# Patient Record
Sex: Female | Born: 1985 | Race: White | Hispanic: No | Marital: Single | Smoking: Never smoker
Health system: Southern US, Community
[De-identification: ages and names within clinical notes are randomized; demographics above are authoritative.]

## PROBLEM LIST (undated history)

## (undated) DIAGNOSIS — E119 Type 2 diabetes mellitus without complications: Secondary | ICD-10-CM

## (undated) DIAGNOSIS — K219 Gastro-esophageal reflux disease without esophagitis: Secondary | ICD-10-CM

## (undated) DIAGNOSIS — N201 Calculus of ureter: Secondary | ICD-10-CM

## (undated) DIAGNOSIS — R3915 Urgency of urination: Secondary | ICD-10-CM

## (undated) DIAGNOSIS — R350 Frequency of micturition: Secondary | ICD-10-CM

## (undated) DIAGNOSIS — Z973 Presence of spectacles and contact lenses: Secondary | ICD-10-CM

## (undated) DIAGNOSIS — E282 Polycystic ovarian syndrome: Secondary | ICD-10-CM

## (undated) DIAGNOSIS — Z87898 Personal history of other specified conditions: Secondary | ICD-10-CM

## (undated) HISTORY — DX: Gastro-esophageal reflux disease without esophagitis: K21.9

---

## 2007-10-22 HISTORY — PX: LAPAROSCOPIC CHOLECYSTECTOMY: SUR755

## 2008-05-23 HISTORY — PX: ESOPHAGOGASTRODUODENOSCOPY: SHX1529

## 2010-03-20 HISTORY — PX: COLONOSCOPY: SHX174

## 2010-05-27 HISTORY — PX: APPENDECTOMY: SHX54

## 2011-07-27 ENCOUNTER — Emergency Department (HOSPITAL_BASED_OUTPATIENT_CLINIC_OR_DEPARTMENT_OTHER)
Admission: EM | Admit: 2011-07-27 | Discharge: 2011-07-27 | Disposition: A | Discharge status: Home / Self Care | Payer: PRIVATE HEALTH INSURANCE | Attending: Emergency Medicine | Admitting: Emergency Medicine

## 2011-07-27 ENCOUNTER — Emergency Department (INDEPENDENT_AMBULATORY_CARE_PROVIDER_SITE_OTHER): Payer: PRIVATE HEALTH INSURANCE

## 2011-07-27 ENCOUNTER — Encounter (HOSPITAL_BASED_OUTPATIENT_CLINIC_OR_DEPARTMENT_OTHER): Payer: Self-pay | Admitting: *Deleted

## 2011-07-27 DIAGNOSIS — M25579 Pain in unspecified ankle and joints of unspecified foot: Secondary | ICD-10-CM | POA: Insufficient documentation

## 2011-07-27 DIAGNOSIS — M79671 Pain in right foot: Secondary | ICD-10-CM

## 2011-07-27 DIAGNOSIS — M79609 Pain in unspecified limb: Secondary | ICD-10-CM

## 2011-07-27 DIAGNOSIS — E119 Type 2 diabetes mellitus without complications: Secondary | ICD-10-CM | POA: Insufficient documentation

## 2011-07-27 LAB — GLUCOSE, CAPILLARY: Glucose-Capillary: 181 mg/dL — ABNORMAL HIGH (ref 70–99)

## 2011-07-27 MED ORDER — HYDROCODONE-ACETAMINOPHEN 5-500 MG PO TABS: 1.0000 | ORAL_TABLET | Freq: Four times a day (QID) | ORAL | Status: DC | PRN

## 2011-07-27 MED ORDER — OXYCODONE-ACETAMINOPHEN 5-325 MG PO TABS: 1.0000 | ORAL_TABLET | Freq: Four times a day (QID) | ORAL | Status: AC | PRN

## 2011-07-27 MED ORDER — NAPROXEN 500 MG PO TABS: 500.0000 mg | ORAL_TABLET | Freq: Two times a day (BID) | ORAL | Status: AC

## 2011-07-27 MED ORDER — OXYCODONE-ACETAMINOPHEN 5-325 MG PO TABS
2.0000 | ORAL_TABLET | Freq: Once | ORAL | Status: AC
  Administered 2011-07-27: 2 via ORAL
  Filled 2011-07-27: qty 2

## 2011-07-27 MED ORDER — IBUPROFEN 800 MG PO TABS
800.0000 mg | ORAL_TABLET | Freq: Once | ORAL | Status: AC
  Administered 2011-07-27: 800 mg via ORAL
  Filled 2011-07-27: qty 1

## 2011-07-27 NOTE — ED Notes (Signed)
Pt c/o right foot pain x 2-3 days. Denies injury. Hx diabetes.

## 2011-07-27 NOTE — ED Notes (Signed)
CBG 181. Results to cross over shortly.

## 2011-07-27 NOTE — Discharge Instructions (Signed)
Contusion (Bruise) of Foot Injury to the foot causes bruises (contusions). Contusions are caused by bleeding from small blood vessels that allow blood to leak out into the muscles, cord-like structures that attach muscle to bone (tendons), and/or other soft tissue.  CAUSES  Contusions of the foot are common. Bruises are frequently seen from:  Contact sports injuries.   The use of medications that thin the blood (anti-coagulants).   Aspirin and non-steroidal anti-inflammatory agents that decrease the clotting ability.   People with vitamin deficiencies.  SYMPTOMS  Signs of foot injury include pain and swelling. At first there may be discoloration from blood under the skin. This will appear blue to purple in color. As the bruise ages, the color turns yellow. Swelling may limit the movement of the toes.  Complications from foot injury may include:  Collections of blood leading to disability if calcium deposits form. These can later limit movement in the foot.   Infection of the foot if there are breaks in the skin.   Rupture of the tendons that may need surgical repair.  DIAGNOSIS  Diagnosing foot injuries can be made by observation. If problems continue, X-rays may be needed to make sure there are no broken bones (fractures). Continuing problems may require physical therapy.  HOME CARE INSTRUCTIONS   Apply ice to the injury for 15 to 20 minutes, 3 to 4 times per day. Put the ice in a plastic bag and place a towel between the bag of ice and your skin.   An elastic wrap (like an Ace bandage) may be used to keep swelling down.   Keep foot elevated to reduce swelling and discomfort.   Try to avoid standing or walking while the foot is painful. Do not resume use until instructed by your caregiver. Then begin use gradually. If pain develops, decrease use and continue the above measures. Gradually increase activities that do not cause discomfort until you slowly have normal use.   Only take  over-the-counter or prescription medicines for pain, discomfort, or fever as directed by your caregiver. Use only if your caregiver has not given medications that would interfere.   Begin daily rehabilitation exercises when supportive wrapping is no longer needed.   Use ice massage for 10 minutes before and after workouts. Fill a large styrofoam cup with water and freeze. Tear a small amount of foam from the top so ice protrudes. Massage ice firmly over the injured area in a circle about the size of a softball.   Always eat a well balanced diet.   Follow all instructions for follow up with your caregiver, any orthopedic referrals, physical therapy and rehabilitation. Any delay in obtaining necessary care could result in delayed healing, and temporary or permanent disability.  SEEK IMMEDIATE MEDICAL CARE IF:   Your pain and swelling increase, or pain is uncontrolled with medications.   You have loss of feeling in your foot, or your foot turns cold or blue.   An oral temperature above 102 F (38.9 C) develops, not controlled by medication.   Your foot becomes warm to touch, or you have more pain with movement of your toes.   You have a foot contusion that does not improve in 1 or 2 days.   Skin is broken and signs of infection occur (drainage, increasing pain, fever, headache, muscle aches, dizziness or a general ill feeling).   You develop new, unexplained symptoms, or an increase of the symptoms that brought you to your caregiver.  MAKE SURE YOU:     Understand these instructions.   Will watch your condition.   Will get help right away if you are not doing well or get worse.  Document Released: 03/04/2006 Document Revised: 01/23/2011 Document Reviewed: 04/28/2007 [employer] Patient Information 2012 [employer]. 

## 2011-07-27 NOTE — ED Provider Notes (Addendum)
History   This chart was scribed for Samantha Bailiff, MD by Samantha Mitchell . The patient was seen in room MH08/MH08 and the patient's care was started at 6:17pm.    CSN: [id number]  Arrival date & time 07/27/11  1803   First MD Initiated Contact with Patient 07/27/11 1814      Chief Complaint  Patient presents with  . Foot Pain    (Consider location/radiation/quality/duration/timing/severity/associated sxs/prior treatment) HPI Samantha Mitchell is a 26 y.o. female who presents to the Emergency Department complaining of constant, moderate, lateral right foot pain for the past 2-3 days. Patient states that she was standing at work when the pain started. Patient denies injury. Patient states that she has diabetes mellitus. Patient states that her blood sugar levels have varied over the past few days. No other medical problems noted. No other symptoms reported.     Past Medical History  Diagnosis Date  . Diabetes mellitus     Past Surgical History  Procedure Date  . Appendectomy   . Cholecystectomy     History reviewed. No pertinent family history.  History  Substance Use Topics  . Smoking status: Never Smoker   . Smokeless tobacco: Not on file  . Alcohol Use: No    OB History    Grav Para Term Preterm Abortions TAB SAB Ect Mult Living                  Review of Systems  Musculoskeletal:       Foot pain  All other systems reviewed and are negative.     Allergies  Review of patient's allergies indicates no known allergies.  Home Medications   Current Outpatient Rx  Name Route Sig Dispense Refill  . GLIPIZIDE ER 5 MG PO TB24 Oral Take 5 mg by mouth daily.    Marland Kitchen RANITIDINE HCL 150 MG PO CAPS Oral Take 150 mg by mouth daily.    Marland Kitchen HYDROCODONE-ACETAMINOPHEN 5-500 MG PO TABS Oral Take 1-2 tablets by mouth every 6 (six) hours as needed for pain. 12 tablet 0  . NAPROXEN 500 MG PO TABS Oral Take 1 tablet (500 mg total) by mouth 2 (two) times daily. 30 tablet 0     BP 159/100  Pulse 112  Temp(Src) 98.1 F (36.7 C) (Oral)  Resp 20  Ht 5\' 5"  (1.651 m)  Wt 271 lb (122.925 kg)  BMI 45.10 kg/m2  SpO2 98%  LMP 07/01/2011  Physical Exam  Nursing note and vitals reviewed. Constitutional: She is oriented to person, place, and time. She appears well-developed and well-nourished. No distress.  HENT:  Head: Normocephalic and atraumatic.  Eyes: EOM are normal. Pupils are equal, round, and reactive to light.  Neck: Normal range of motion. Neck supple. No tracheal deviation present.  Cardiovascular: Regular rhythm, normal heart sounds and intact distal pulses.  Exam reveals no gallop and no friction rub.   No murmur heard.      Tachycardic.   Pulmonary/Chest: Effort normal and breath sounds normal. No respiratory distress. She has no wheezes. She has no rales.  Abdominal: Soft. Bowel sounds are normal. She exhibits no distension.  Musculoskeletal: Normal range of motion. She exhibits no edema.       Right lateral foot pain from the midline to distal. No plantar pain. Full ROM of right foot. Intact distal pulses.   Neurological: She is alert and oriented to person, place, and time. No sensory deficit.  Skin: Skin is warm and dry.  Psychiatric:  She has a normal mood and affect. Her behavior is normal.    ED Course  Procedures (including critical care time)  DIAGNOSTIC STUDIES: Oxygen Saturation is 98% on room air, normal by my interpretation.    COORDINATION OF CARE:  1820: Discussed with patient the planned course of treatment.  1830: Medication Orders: Oxycodone-acetaminophen 5-325 mg per tablet 2 tablet-once; Ibuprofen tablet 800 mg-once.  1929: Recheck: Patient informed of imaging results and planned d/c.  Labs Reviewed  GLUCOSE, CAPILLARY - Abnormal; Notable for the following:    Glucose-Capillary 181 (*)    All other components within normal limits   Dg Foot Complete Right  07/27/2011  *RADIOLOGY REPORT*  Clinical Data: Foot pain   RIGHT FOOT COMPLETE - 3+ VIEW  Comparison: None  Findings: There is no evidence of fracture or dislocation.  There is no evidence of arthropathy or other focal bone abnormality. Soft tissues are unremarkable.  IMPRESSION: Negative exam.  Original Report Authenticated By: Rosealee Albee, M.D.     1. Foot pain, right       MDM  HR improved on reassessment. Imaging is negative. She does have a history of diabetes. I encouraged her to followup with her primary care physician on a regular basis for routine foot care. There is no cellulitis or concerning lesions.   I personally performed the services described in this documentation, which was scribed in my presence. The recorded information has been reviewed and considered.       Samantha Bailiff, MD 07/27/11 1937  I was informed by nursing that the patient stated that "Vicodin does not help my pain." She requested Percocet for her pain. She was given the option of the full prescription of Vicodin versus a half prescription of Percocet at which time she shows a half prescription of Percocet.  Samantha Bailiff, MD 07/27/11 1945

## 2012-10-16 ENCOUNTER — Ambulatory Visit (INDEPENDENT_AMBULATORY_CARE_PROVIDER_SITE_OTHER): Payer: BC Managed Care – PPO | Admitting: Family Medicine

## 2012-10-16 VITALS — BP 118/82 | HR 92 | Wt 241.0 lb

## 2012-10-16 DIAGNOSIS — IMO0001 Reserved for inherently not codable concepts without codable children: Secondary | ICD-10-CM

## 2012-10-16 DIAGNOSIS — R6889 Other general symptoms and signs: Secondary | ICD-10-CM

## 2012-10-16 DIAGNOSIS — D72829 Elevated white blood cell count, unspecified: Secondary | ICD-10-CM

## 2012-10-16 DIAGNOSIS — R0989 Other specified symptoms and signs involving the circulatory and respiratory systems: Secondary | ICD-10-CM

## 2012-10-16 LAB — CBC WITH DIFFERENTIAL/PLATELET
Basophils Absolute: 0 10*3/uL (ref 0.0–0.1)
Basophils Relative: 0 % (ref 0–1)
Eosinophils Absolute: 0 10*3/uL (ref 0.0–0.7)
Eosinophils Relative: 0 % (ref 0–5)
HCT: 38.5 % (ref 36.0–46.0)
Hemoglobin: 13.2 g/dL (ref 12.0–15.0)
Lymphocytes Relative: 32 % (ref 12–46)
Lymphs Abs: 3.5 10*3/uL (ref 0.7–4.0)
MCH: 26.4 pg (ref 26.0–34.0)
MCHC: 34.3 g/dL (ref 30.0–36.0)
MCV: 77 fL — ABNORMAL LOW (ref 78.0–100.0)
Monocytes Absolute: 0.8 10*3/uL (ref 0.1–1.0)
Monocytes Relative: 7 % (ref 3–12)
Neutro Abs: 6.6 10*3/uL (ref 1.7–7.7)
Neutrophils Relative %: 61 % (ref 43–77)
Platelets: 408 10*3/uL — ABNORMAL HIGH (ref 150–400)
RBC: 5 MIL/uL (ref 3.87–5.11)
RDW: 14.1 % (ref 11.5–15.5)
WBC: 10.8 10*3/uL — ABNORMAL HIGH (ref 4.0–10.5)

## 2012-10-16 LAB — COMPLETE METABOLIC PANEL WITH GFR
ALT: 18 U/L (ref 0–35)
AST: 16 U/L (ref 0–37)
Albumin: 4.2 g/dL (ref 3.5–5.2)
Alkaline Phosphatase: 44 U/L (ref 39–117)
BUN: 8 mg/dL (ref 6–23)
CO2: 23 mEq/L (ref 19–32)
Calcium: 9.9 mg/dL (ref 8.4–10.5)
Chloride: 106 mEq/L (ref 96–112)
Creat: 0.75 mg/dL (ref 0.50–1.10)
GFR, Est African American: >89 mL/min
GFR, Est Non African American: >89 mL/min
Glucose, Bld: 88 mg/dL (ref 70–99)
Potassium: 4.3 mEq/L (ref 3.5–5.3)
Sodium: 140 mEq/L (ref 135–145)
Total Bilirubin: 0.4 mg/dL (ref 0.3–1.2)
Total Protein: 7.3 g/dL (ref 6.0–8.3)

## 2012-10-16 LAB — HEMOGLOBIN A1C
Hgb A1c MFr Bld: 5.3 % (ref <5.7)
Mean Plasma Glucose: 105 mg/dL (ref <117)

## 2012-10-16 LAB — CHOLESTEROL, TOTAL: Cholesterol: 172 mg/dL (ref 0–200)

## 2012-10-16 MED ORDER — PANTOPRAZOLE SODIUM 40 MG PO TBEC: 40.0000 mg | DELAYED_RELEASE_TABLET | Freq: Every day | ORAL | Status: DC

## 2012-10-16 MED ORDER — METFORMIN HCL ER 500 MG PO TB24: ORAL_TABLET | ORAL | Status: DC

## 2012-10-16 MED ORDER — ESOMEPRAZOLE MAGNESIUM 40 MG PO CPDR: 40.0000 mg | DELAYED_RELEASE_CAPSULE | Freq: Every day | ORAL | Status: DC

## 2012-10-16 NOTE — Patient Instructions (Addendum)
1)  DM - We are rechecking your A1c today.  We'll change you to the extended release version of the metformin.  You can take up to 4 per day.  2)  Stomach/Throat - Try to get back on a PPI to see if this changes the sensation you are having.  If not, then F/U with an ENT.    Gastroesophageal Reflux Disease, Adult Gastroesophageal reflux disease (GERD) happens when acid from your stomach flows up into the esophagus. When acid comes in contact with the esophagus, the acid causes soreness (inflammation) in the esophagus. Over time, GERD may create small holes (ulcers) in the lining of the esophagus. CAUSES   Increased body weight. This puts pressure on the stomach, making acid rise from the stomach into the esophagus.  Smoking. This increases acid production in the stomach.  Drinking alcohol. This causes decreased pressure in the lower esophageal sphincter (valve or ring of muscle between the esophagus and stomach), allowing acid from the stomach into the esophagus.  Late evening meals and a full stomach. This increases pressure and acid production in the stomach.  A malformed lower esophageal sphincter. Sometimes, no cause is found. SYMPTOMS   Burning pain in the lower part of the mid-chest behind the breastbone and in the mid-stomach area. This may occur twice a week or more often.  Trouble swallowing.  Sore throat.  Dry cough.  Asthma-like symptoms including chest tightness, shortness of breath, or wheezing. DIAGNOSIS  Your caregiver may be able to diagnose GERD based on your symptoms. In some cases, X-rays and other tests may be done to check for complications or to check the condition of your stomach and esophagus. TREATMENT  Your caregiver may recommend over-the-counter or prescription medicines to help decrease acid production. Ask your caregiver before starting or adding any new medicines.  HOME CARE INSTRUCTIONS   Change the factors that you can control. Ask your caregiver  for guidance concerning weight loss, quitting smoking, and alcohol consumption.  Avoid foods and drinks that make your symptoms worse, such as:  Caffeine or alcoholic drinks.  Chocolate.  Peppermint or mint flavorings.  Garlic and onions.  Spicy foods.  Citrus fruits, such as oranges, lemons, or limes.  Tomato-based foods such as sauce, chili, salsa, and pizza.  Fried and fatty foods.  Avoid lying down for the 3 hours prior to your bedtime or prior to taking a nap.  Eat small, frequent meals instead of large meals.  Wear loose-fitting clothing. Do not wear anything tight around your waist that causes pressure on your stomach.  Raise the head of your bed 6 to 8 inches with wood blocks to help you sleep. Extra pillows will not help.  Only take over-the-counter or prescription medicines for pain, discomfort, or fever as directed by your caregiver.  Do not take aspirin, ibuprofen, or other nonsteroidal anti-inflammatory drugs (NSAIDs). SEEK IMMEDIATE MEDICAL CARE IF:   You have pain in your arms, neck, jaw, teeth, or back.  Your pain increases or changes in intensity or duration.  You develop nausea, vomiting, or sweating (diaphoresis).  You develop shortness of breath, or you faint.  Your vomit is green, yellow, black, or looks like coffee grounds or blood.  Your stool is red, bloody, or black. These symptoms could be signs of other problems, such as heart disease, gastric bleeding, or esophageal bleeding. MAKE SURE YOU:   Understand these instructions.  Will watch your condition.  Will get help right away if you are not doing  well or get worse. Document Released: 02/20/2005 Document Revised: 08/05/2011 Document Reviewed: 11/30/2010 [facility] Patient Information 2014 [place], [place].

## 2012-10-31 ENCOUNTER — Encounter: Payer: Self-pay | Admitting: Family Medicine

## 2012-10-31 DIAGNOSIS — R6889 Other general symptoms and signs: Secondary | ICD-10-CM | POA: Insufficient documentation

## 2012-10-31 DIAGNOSIS — D72829 Elevated white blood cell count, unspecified: Secondary | ICD-10-CM | POA: Insufficient documentation

## 2012-10-31 DIAGNOSIS — IMO0001 Reserved for inherently not codable concepts without codable children: Secondary | ICD-10-CM | POA: Insufficient documentation

## 2012-10-31 DIAGNOSIS — R0989 Other specified symptoms and signs involving the circulatory and respiratory systems: Secondary | ICD-10-CM | POA: Insufficient documentation

## 2012-10-31 NOTE — Assessment & Plan Note (Signed)
Her WBC count was elevated back in February.  We'll recheck her level.

## 2012-10-31 NOTE — Progress Notes (Signed)
  Subjective:    Patient ID: Samantha Mitchell, female    DOB: 1986/01/05, 27 y.o.   MRN: [id number]  HPI  Samantha Mitchell is in for a recheck of her diabetes.  She also wants to discuss the sensation she has in her throat.  She has done well with her weight loss (40 lbs since November).  She has been taking a combination of Bydureon and Metformin for her diabetes.  She has had some GI upset with the Metformin.   Review of Systems  Constitutional: Positive for appetite change. Negative for fatigue and unexpected weight change (She has been working on her diet (40 lbs loss) ).  HENT:       She feels a sensation/discomfort in her throat.    Endocrine: Negative for polydipsia, polyphagia and polyuria.   Past Medical History  Diagnosis Date  . Diabetes mellitus   . GERD (gastroesophageal reflux disease)   . Acne   . Plantar fasciitis    Family History  Problem Relation Age of Onset  . Hyperlipidemia Mother   . Hypertension Mother   . Cancer Mother     Laryngeal Cancer  . GER disease Mother   . Endometriosis Mother   . Anxiety disorder Mother   . Diabetes Father   . Congestive Heart Failure Father     Viral Cardiomyopathy  . Hyperlipidemia Father   . Heart disease Maternal Grandmother   . Hypertension Maternal Grandmother   . Heart disease Maternal Grandfather   . Hypertension Maternal Grandfather   . Hyperlipidemia Maternal Grandfather   . Heart disease Paternal Grandmother   . Heart disease Paternal Grandfather    History   Social History Narrative   Marital Status:  Engaged Programmer, multimedia)      Children:  None    Pets: Dogs (Daisy/Grumpy)    Living Situation: Lives with parents when she is home.    Occupation: Engineer, materials - [employer]    Education: Bachelor's Degree in [place]      Tobacco Use:  None    Alcohol Use:  Occasional    Drug Use:  None   Diet:  Regular   Exercise:  Limited    Hobbies:  Reading                 Objective:   Physical Exam  Constitutional: She appears  well-nourished. No distress.  HENT:  Head: Normocephalic.  Mouth/Throat: No oropharyngeal exudate.  Eyes: No scleral icterus.  Neck: Normal range of motion. Neck supple. No tracheal deviation present. No thyromegaly present.  Cardiovascular: Normal rate, regular rhythm and normal heart sounds.   Pulmonary/Chest: Effort normal and breath sounds normal.  Abdominal: There is no tenderness.  Musculoskeletal: She exhibits no edema and no tenderness.  Lymphadenopathy:    She has no cervical adenopathy.  Neurological: She is alert.  Skin: Skin is warm and dry.  Psychiatric: She has a normal mood and affect. Her behavior is normal. Judgment and thought content normal.      Assessment & Plan:   TIME 30 MINUTES:  MORE THAN 50 % OF TIME WAS INVOLVED IN COUNSELING.

## 2012-10-31 NOTE — Assessment & Plan Note (Signed)
She feels that she has GI side effects due to the Metformin.  We'll change her to the extended release formulation.  She will remain on Bydureon.  We'll check her A1c.

## 2012-10-31 NOTE — Assessment & Plan Note (Signed)
She is doing to get back on Nexium to see if it will decrease the sensation she has been having in her throat.  If it does not improve, she will F/U with an ENT.

## 2012-12-06 ENCOUNTER — Other Ambulatory Visit: Payer: Self-pay | Admitting: Family Medicine

## 2012-12-07 NOTE — Telephone Encounter (Signed)
Can you contact her and set her up for an appt to recheck her a1C and see Dr. Alberteen Sam. (after 7/23) Thanks PG

## 2012-12-08 NOTE — Telephone Encounter (Signed)
Spoke with the patient and advised that we refilled the medication. Also advised that she needs to come in for an appointment. She stated that she would look at her work schedule and call us back to make appointment.

## 2012-12-25 ENCOUNTER — Encounter: Payer: Self-pay | Admitting: *Deleted

## 2013-01-06 ENCOUNTER — Ambulatory Visit (INDEPENDENT_AMBULATORY_CARE_PROVIDER_SITE_OTHER): Payer: BC Managed Care – PPO | Admitting: Family Medicine

## 2013-01-06 VITALS — BP 116/74 | HR 92 | Resp 16 | Ht 66.5 in | Wt 236.0 lb

## 2013-01-06 DIAGNOSIS — E119 Type 2 diabetes mellitus without complications: Secondary | ICD-10-CM

## 2013-01-06 MED ORDER — METFORMIN HCL ER 500 MG PO TB24: ORAL_TABLET | ORAL | Status: DC

## 2013-01-06 MED ORDER — EXENATIDE ER 2 MG ~~LOC~~ SUSR: 2.0000 mg | SUBCUTANEOUS | Status: DC

## 2013-01-06 MED ORDER — CANAGLIFLOZIN 300 MG PO TABS: 300.0000 mg | ORAL_TABLET | Freq: Every day | ORAL | Status: DC

## 2013-01-06 NOTE — Progress Notes (Signed)
  Subjective:    Patient ID: Samantha Mitchell, female    DOB: 07-24-1985, 27 y.o.   MRN: [id number]  HPI  Samantha Mitchell is here today to follow up on her Type II DM.  She needs her A1c rechecked today.  She has done well on the combination of metformin, Bydureon and Invokana and needs a refill on these medications.      Review of Systems  Constitutional: Negative for activity change, appetite change, fatigue and unexpected weight change.  HENT: Negative.   Eyes: Negative.  Negative for visual disturbance.  Respiratory: Negative for chest tightness and shortness of breath.   Cardiovascular: Negative for chest pain, palpitations and leg swelling.  Gastrointestinal: Negative for diarrhea and constipation.  Endocrine: Negative.  Negative for polydipsia, polyphagia and polyuria.  Genitourinary: Negative for urgency, frequency and difficulty urinating.  Musculoskeletal: Negative.   Skin: Negative.   Neurological: Negative.  Negative for light-headedness and numbness.  Hematological: Negative for adenopathy. Does not bruise/bleed easily.  Psychiatric/Behavioral: Negative for sleep disturbance and dysphoric mood. The patient is not nervous/anxious.     Past Medical History  Diagnosis Date  . Diabetes mellitus   . GERD (gastroesophageal reflux disease)   . Acne   . Plantar fasciitis     Family History  Problem Relation Age of Onset  . Hyperlipidemia Mother   . Hypertension Mother   . Cancer Mother     Laryngeal Cancer  . GER disease Mother   . Endometriosis Mother   . Anxiety disorder Mother   . Diabetes Father   . Congestive Heart Failure Father     Viral Cardiomyopathy  . Hyperlipidemia Father   . Heart disease Maternal Grandmother   . Hypertension Maternal Grandmother   . Heart disease Maternal Grandfather   . Hypertension Maternal Grandfather   . Hyperlipidemia Maternal Grandfather   . Heart disease Paternal Grandmother   . Heart disease Paternal Grandfather     History   Social  History Narrative   Marital Status:  Engaged Programmer, multimedia)      Children:  None    Pets: Dogs (Daisy/Grumpy)    Living Situation: Lives with parents when she is home.    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 Constitutional: She is oriented to person, place, and time.  Eyes: Conjunctivae are normal. No scleral icterus.  Neck: Neck supple. No thyromegaly present.  Cardiovascular: Normal rate, regular rhythm and normal heart sounds.   Pulmonary/Chest: Effort normal and breath sounds normal.  Musculoskeletal: She exhibits no edema and no tenderness.  Lymphadenopathy:    She has no cervical adenopathy.  Neurological: She is alert and oriented to person, place, and time.  Skin: Skin is warm and dry.  Psychiatric: She has a normal mood and affect. Her behavior is normal. Judgment and thought content normal.      Assessment & Plan:

## 2013-01-06 NOTE — Patient Instructions (Addendum)
Nonspecific Tachycardia Tachycardia is a faster than normal heartbeat (more than 100 beats per minute). In adults, the heart normally beats between 60 and 100 times a minute. A fast heartbeat may be a normal response to exercise or stress. It does not necessarily mean that something is wrong. However, sometimes when your heart beats too fast it may not be able to pump enough blood to the rest of your body. This can result in chest pain, shortness of breath, dizziness, and even fainting. Nonspecific tachycardia means that the specific cause or pattern of your tachycardia is unknown. CAUSES  Tachycardia may be harmless or it may be due to a more serious underlying cause. Possible causes of tachycardia include:  Exercise or exertion.  Fever.  Pain or injury.  Infection.  Loss of body fluids (dehydration).  Overactive thyroid.  Lack of red blood cells (anemia).  Anxiety and stress.  Alcohol.  Caffeine.  Tobacco products.  Diet pills.  Illegal drugs.  Heart disease. SYMPTOMS  Rapid or irregular heartbeat (palpitations).  Suddenly feeling your heart beating (cardiac awareness).  Dizziness.  Tiredness (fatigue).  Shortness of breath.  Chest pain.  Nausea.  Fainting. DIAGNOSIS  Your caregiver will perform a physical exam and take your medical history. In some cases, a heart specialist (cardiologist) may be consulted. Your caregiver may also order:  Blood tests.  Electrocardiography. This test records the electrical activity of your heart.  A heart monitoring test. TREATMENT  Treatment will depend on the likely cause of your tachycardia. The goal is to treat the underlying cause of your tachycardia. Treatment methods may include:  Replacement of fluids or blood through an intravenous (IV) tube for moderate to severe dehydration or anemia.  New medicines or changes in your current medicines.  Diet and lifestyle changes.  Treatment for certain  infections.  Stress relief or relaxation methods. HOME CARE INSTRUCTIONS   Rest.  Drink enough fluids to keep your urine clear or pale yellow.  Do not smoke.  Avoid:  Caffeine.  Tobacco.  Alcohol.  Chocolate.  Stimulants such as over-the-counter diet pills or pills that help you stay awake.  Situations that cause anxiety or stress.  Illegal drugs such as marijuana, phencyclidine (PCP), and cocaine.  Only take medicine as directed by your caregiver.  Keep all follow-up appointments as directed by your caregiver. SEEK IMMEDIATE MEDICAL CARE IF:   You have pain in your chest, upper arms, jaw, or neck.  You become weak, dizzy, or feel faint.  You have palpitations that will not go away.  You vomit, have diarrhea, or pass blood in your stool.  Your skin is cool, pale, and wet.  You have a fever that will not go away with rest, fluids, and medicine. MAKE SURE YOU:   Understand these instructions.  Will watch your condition.  Will get help right away if you are not doing well or get worse. Document Released: 06/20/2004 Document Revised: 08/05/2011 Document Reviewed: 04/23/2011 [employer] Patient Information 2014 [employer].  

## 2013-01-21 ENCOUNTER — Other Ambulatory Visit (INDEPENDENT_AMBULATORY_CARE_PROVIDER_SITE_OTHER): Payer: BC Managed Care – PPO | Admitting: *Deleted

## 2013-01-21 DIAGNOSIS — IMO0001 Reserved for inherently not codable concepts without codable children: Secondary | ICD-10-CM

## 2013-01-21 LAB — POCT GLYCOSYLATED HEMOGLOBIN (HGB A1C): Hemoglobin A1C: 5.8

## 2013-02-21 ENCOUNTER — Encounter: Payer: Self-pay | Admitting: Family Medicine

## 2013-02-21 DIAGNOSIS — E119 Type 2 diabetes mellitus without complications: Secondary | ICD-10-CM | POA: Insufficient documentation

## 2013-02-21 NOTE — Assessment & Plan Note (Signed)
Her A1c is great at 5.8%.  She will continue to work on her diet and exercise.  She was given refills for her medications.

## 2013-07-12 ENCOUNTER — Ambulatory Visit (INDEPENDENT_AMBULATORY_CARE_PROVIDER_SITE_OTHER): Payer: BC Managed Care – PPO | Admitting: Family Medicine

## 2013-07-12 ENCOUNTER — Encounter: Payer: Self-pay | Admitting: Family Medicine

## 2013-07-12 VITALS — BP 113/76 | HR 91 | Resp 16 | Ht 65.25 in | Wt 244.0 lb

## 2013-07-12 DIAGNOSIS — E119 Type 2 diabetes mellitus without complications: Secondary | ICD-10-CM

## 2013-07-12 DIAGNOSIS — K219 Gastro-esophageal reflux disease without esophagitis: Secondary | ICD-10-CM

## 2013-07-12 LAB — POCT GLYCOSYLATED HEMOGLOBIN (HGB A1C): Hemoglobin A1C: 5.9

## 2013-07-12 MED ORDER — EXENATIDE ER 2 MG ~~LOC~~ SUSR: 2.0000 mg | SUBCUTANEOUS | Status: DC

## 2013-07-12 MED ORDER — CANAGLIFLOZIN-METFORMIN HCL 150-500 MG PO TABS: 1.0000 | ORAL_TABLET | Freq: Two times a day (BID) | ORAL | Status: DC

## 2013-07-12 MED ORDER — EXENATIDE ER 2 MG ~~LOC~~ PEN: 1.0000 "pen " | PEN_INJECTOR | SUBCUTANEOUS | Status: DC

## 2013-07-12 MED ORDER — PANTOPRAZOLE SODIUM 40 MG PO TBEC: 40.0000 mg | DELAYED_RELEASE_TABLET | Freq: Every day | ORAL | Status: DC

## 2013-07-12 NOTE — Patient Instructions (Addendum)
1)  Blood Sugar - Your A1c has gone up a little so you need to add some exercise to help keep your blood sugars normal.    2)  Weight - If you want to lose 1 lb per week, you will need to decrease your calories by 500 per day - 1 lb = 3500 calories.     Diabetes and Exercise Exercising regularly is important. It is not just about losing weight. It has many health benefits, such as:  Improving your overall fitness, flexibility, and endurance.  Increasing your bone density.  Helping with weight control.  Decreasing your body fat.  Increasing your muscle strength.  Reducing stress and tension.  Improving your overall health. People with diabetes who exercise gain additional benefits because exercise:  Reduces appetite.  Improves the body's use of blood sugar (glucose).  Helps lower or control blood glucose.  Decreases blood pressure.  Helps control blood lipids (such as cholesterol and triglycerides).  Improves the body's use of the hormone insulin by:  Increasing the body's insulin sensitivity.  Reducing the body's insulin needs.  Decreases the risk for heart disease because exercising:  Lowers cholesterol and triglycerides levels.  Increases the levels of good cholesterol (such as high-density lipoproteins [HDL]) in the body.  Lowers blood glucose levels. YOUR ACTIVITY PLAN  Choose an activity that you enjoy and set realistic goals. Your health care provider or diabetes educator can help you make an activity plan that works for you. You can break activities into 2 or 3 sessions throughout the day. Doing so is as good as one long session. Exercise ideas include:  Taking the dog for a walk.  Taking the stairs instead of the elevator.  Dancing to your favorite song.  Doing your favorite exercise with a friend. RECOMMENDATIONS FOR EXERCISING WITH TYPE 1 OR TYPE 2 DIABETES   Check your blood glucose before exercising. If blood glucose levels are greater than 240  mg/dL, check for urine ketones. Do not exercise if ketones are present.  Avoid injecting insulin into areas of the body that are going to be exercised. For example, avoid injecting insulin into:  The arms when playing tennis.  The legs when jogging.  Keep a record of:  Food intake before and after you exercise.  Expected peak times of insulin action.  Blood glucose levels before and after you exercise.  The type and amount of exercise you have done.  Review your records with your health care provider. Your health care provider will help you to develop guidelines for adjusting food intake and insulin amounts before and after exercising.  If you take insulin or oral hypoglycemic agents, watch for signs and symptoms of hypoglycemia. They include:  Dizziness.  Shaking.  Sweating.  Chills.  Confusion.  Drink plenty of water while you exercise to prevent dehydration or heat stroke. Body water is lost during exercise and must be replaced.  Talk to your health care provider before starting an exercise program to make sure it is safe for you. Remember, almost any type of activity is better than none. Document Released: 08/03/2003 Document Revised: 01/13/2013 Document Reviewed: 10/20/2012 [facility] Patient Information 2014 [place]Care, [place].

## 2013-07-12 NOTE — Progress Notes (Signed)
Subjective:    Patient ID: Samantha RaisinSarah Graddy, female    DOB: Sep 09, 1985, 28 y.o.   MRN: [id number]  HPI  [name] is here today with her fiance Reuel Boom(Daniel).  She needs to have several of her Type II DM medications refilled.  She is taking a combination of Metformin (500 mg twice daily), Bydureon once weekly and Invokana (300 mg once daily).  She needs her A1C rechecked.  She admits to not watching her diet as closely and not exercising like she should.     Review of Systems  Constitutional: Negative for activity change, appetite change, fatigue and unexpected weight change.  HENT: Negative.   Eyes: Negative.   Respiratory: Negative for chest tightness and shortness of breath.   Cardiovascular: Negative for chest pain, palpitations and leg swelling.  Gastrointestinal: Negative for diarrhea and constipation.  Endocrine: Negative.  Negative for polydipsia, polyphagia and polyuria.  Genitourinary: Negative for urgency, frequency and difficulty urinating.  Musculoskeletal: Negative.   Skin: Negative.   Neurological: Negative.  Negative for light-headedness and numbness.  Hematological: Negative for adenopathy. Does not bruise/bleed easily.  Psychiatric/Behavioral: Negative for sleep disturbance and dysphoric mood. The patient is not nervous/anxious.      Past Medical History  Diagnosis Date  . Diabetes mellitus   . GERD (gastroesophageal reflux disease)   . Acne   . Plantar fasciitis      Past Surgical History  Procedure Laterality Date  . Appendectomy    . Cholecystectomy       History   Social History Narrative   Marital Status:  Engaged Programmer, multimedia(Daniel)      Children:  None    Pets: Dogs (Daisy/Grumpy)    Living Situation: Lives with parents when she is home.    Occupation: Engineer, materialsTeller - [employer]    Education: Bachelor's Degree in [place]      Tobacco Use:  None    Alcohol Use:  Occasional    Drug Use:  None   Diet:  Regular   Exercise:  Limited    Hobbies:  Reading                Family History  Problem Relation Age of Onset  . Hyperlipidemia Mother   . Hypertension Mother   . Cancer Mother     Laryngeal Cancer  . GER disease Mother   . Endometriosis Mother   . Anxiety disorder Mother   . Diabetes Father   . Congestive Heart Failure Father     Viral Cardiomyopathy  . Hyperlipidemia Father   . Cancer Father   . Heart disease Maternal Grandmother   . Hypertension Maternal Grandmother   . Heart disease Maternal Grandfather   . Hypertension Maternal Grandfather   . Hyperlipidemia Maternal Grandfather   . Heart disease Paternal Grandmother   . Heart disease Paternal Grandfather      Current Outpatient Prescriptions on File Prior to Visit  Medication Sig Dispense Refill  . Canagliflozin (INVOKANA) 300 MG TABS Take 1 tablet (300 mg total) by mouth daily.  30 tablet  5  . metFORMIN (GLUCOPHAGE XR) 500 MG 24 hr tablet Take up to 4 tabs per day  120 tablet  5  . NUVARING 0.12-0.015 MG/24HR vaginal ring        No current facility-administered medications on file prior to visit.     No Known Allergies      Objective:   Physical Exam  Vitals reviewed. Constitutional: She is oriented to person, place, and time.  Eyes:  Conjunctivae are normal. No scleral icterus.  Neck: Neck supple. No thyromegaly present.  Cardiovascular: Normal rate, regular rhythm and normal heart sounds.   Pulmonary/Chest: Effort normal and breath sounds normal.  Musculoskeletal: She exhibits no edema and no tenderness.  Lymphadenopathy:    She has no cervical adenopathy.  Neurological: She is alert and oriented to person, place, and time.  Skin: Skin is warm and dry.  Psychiatric: She has a normal mood and affect. Her behavior is normal. Judgment and thought content normal.      Assessment & Plan:    Katianne was seen today for medication management.  Diagnoses and associated orders for this visit:  Type II or unspecified type diabetes mellitus without mention of  complication, not stated as uncontrolled Comments: Her A1c has increased from 5.8% to 5.9%. She is going to get back to watching her diet more closely and she is to increase her exercise.    - POCT glycosylated hemoglobin (Hb A1C) - Canagliflozin-Metformin HCl 150-500 MG TABS; Take 1 tablet by mouth 2 (two) times daily. - Exenatide ER (BYDUREON) 2 MG SUSR; Inject 2 mg into the skin once a week. - Exenatide ER (BYDUREON) 2 MG PEN; Inject 1 pen into the skin once a week.  GERD (gastroesophageal reflux disease) - pantoprazole (PROTONIX) 40 MG tablet; Take 1 tablet (40 mg total) by mouth daily.

## 2013-09-19 DIAGNOSIS — K219 Gastro-esophageal reflux disease without esophagitis: Secondary | ICD-10-CM | POA: Insufficient documentation

## 2013-10-11 ENCOUNTER — Telehealth: Payer: Self-pay

## 2013-10-11 NOTE — Telephone Encounter (Signed)
[facility] called and wants to know if she should be taking both metformin and invokana, please call them back at [phone] and let them know.

## 2013-10-30 ENCOUNTER — Emergency Department (HOSPITAL_BASED_OUTPATIENT_CLINIC_OR_DEPARTMENT_OTHER)
Admission: EM | Admit: 2013-10-30 | Discharge: 2013-10-30 | Disposition: A | Discharge status: Home / Self Care | Payer: BC Managed Care – PPO | Attending: Emergency Medicine | Admitting: Emergency Medicine

## 2013-10-30 ENCOUNTER — Encounter (HOSPITAL_BASED_OUTPATIENT_CLINIC_OR_DEPARTMENT_OTHER): Payer: Self-pay | Admitting: Emergency Medicine

## 2013-10-30 ENCOUNTER — Emergency Department (HOSPITAL_BASED_OUTPATIENT_CLINIC_OR_DEPARTMENT_OTHER): Payer: BC Managed Care – PPO

## 2013-10-30 DIAGNOSIS — K219 Gastro-esophageal reflux disease without esophagitis: Secondary | ICD-10-CM | POA: Insufficient documentation

## 2013-10-30 DIAGNOSIS — E119 Type 2 diabetes mellitus without complications: Secondary | ICD-10-CM | POA: Insufficient documentation

## 2013-10-30 DIAGNOSIS — Z3202 Encounter for pregnancy test, result negative: Secondary | ICD-10-CM | POA: Insufficient documentation

## 2013-10-30 DIAGNOSIS — Z9089 Acquired absence of other organs: Secondary | ICD-10-CM | POA: Insufficient documentation

## 2013-10-30 DIAGNOSIS — Z872 Personal history of diseases of the skin and subcutaneous tissue: Secondary | ICD-10-CM | POA: Insufficient documentation

## 2013-10-30 DIAGNOSIS — Z8739 Personal history of other diseases of the musculoskeletal system and connective tissue: Secondary | ICD-10-CM | POA: Insufficient documentation

## 2013-10-30 DIAGNOSIS — N201 Calculus of ureter: Secondary | ICD-10-CM

## 2013-10-30 DIAGNOSIS — Z79899 Other long term (current) drug therapy: Secondary | ICD-10-CM | POA: Insufficient documentation

## 2013-10-30 LAB — URINE MICROSCOPIC-ADD ON

## 2013-10-30 LAB — URINALYSIS, ROUTINE W REFLEX MICROSCOPIC
BILIRUBIN URINE: NEGATIVE
GLUCOSE, UA: NEGATIVE mg/dL
KETONES UR: 15 mg/dL — AB
NITRITE: NEGATIVE
PH: 5 (ref 5.0–8.0)
PROTEIN: 100 mg/dL — AB
Specific Gravity, Urine: 1.027 (ref 1.005–1.030)
Urobilinogen, UA: 0.2 mg/dL (ref 0.0–1.0)

## 2013-10-30 LAB — PREGNANCY, URINE: PREG TEST UR: NEGATIVE

## 2013-10-30 MED ORDER — PROMETHAZINE HCL 25 MG/ML IJ SOLN
12.5000 mg | Freq: Once | INTRAMUSCULAR | Status: AC
  Administered 2013-10-30: 12.5 mg via INTRAVENOUS
  Filled 2013-10-30: qty 1

## 2013-10-30 MED ORDER — TAMSULOSIN HCL 0.4 MG PO CAPS: ORAL_CAPSULE | ORAL | Status: DC

## 2013-10-30 MED ORDER — OXYCODONE-ACETAMINOPHEN 10-325 MG PO TABS: 1.0000 | ORAL_TABLET | ORAL | Status: DC | PRN

## 2013-10-30 MED ORDER — ONDANSETRON HCL 4 MG/2ML IJ SOLN
4.0000 mg | Freq: Once | INTRAMUSCULAR | Status: AC
  Administered 2013-10-30: 4 mg via INTRAVENOUS
  Filled 2013-10-30: qty 2

## 2013-10-30 MED ORDER — TAMSULOSIN HCL 0.4 MG PO CAPS
0.4000 mg | ORAL_CAPSULE | Freq: Once | ORAL | Status: AC
  Administered 2013-10-30: 0.4 mg via ORAL
  Filled 2013-10-30: qty 1

## 2013-10-30 MED ORDER — FENTANYL CITRATE 0.05 MG/ML IJ SOLN
100.0000 ug | Freq: Once | INTRAMUSCULAR | Status: AC
  Administered 2013-10-30: 100 ug via INTRAVENOUS
  Filled 2013-10-30: qty 2

## 2013-10-30 MED ORDER — KETOROLAC TROMETHAMINE 15 MG/ML IJ SOLN
15.0000 mg | Freq: Once | INTRAMUSCULAR | Status: AC
  Administered 2013-10-30: 15 mg via INTRAVENOUS
  Filled 2013-10-30: qty 1

## 2013-10-30 MED ORDER — SODIUM CHLORIDE 0.9 % IV SOLN
INTRAVENOUS | Status: DC
  Administered 2013-10-30: 03:00:00 via INTRAVENOUS

## 2013-10-30 MED ORDER — PROMETHAZINE HCL 25 MG PO TABS: 25.0000 mg | ORAL_TABLET | Freq: Four times a day (QID) | ORAL | Status: DC | PRN

## 2013-10-30 MED ORDER — NAPROXEN SODIUM 220 MG PO TABS: ORAL_TABLET | ORAL | Status: DC

## 2013-10-30 NOTE — Discharge Instructions (Signed)
Ureteral Colic (Kidney Stones) °Ureteral colic is the result of a condition when kidney stones form inside the kidney. Once kidney stones are formed they may move into the tube that connects the kidney with the bladder (ureter). If this occurs, this condition may cause pain (colic) in the ureter.  °CAUSES  °Pain is caused by stone movement in the ureter and the obstruction caused by the stone. °SYMPTOMS  °The pain comes and goes as the ureter contracts around the stone. The pain is usually intense, sharp, and stabbing in character. The location of the pain may move as the stone moves through the ureter. When the stone is near the kidney the pain is usually located in the back and radiates to the belly (abdomen). When the stone is ready to pass into the bladder the pain is often located in the lower abdomen on the side the stone is located. At this location, the symptoms may mimic those of a urinary tract infection with urinary frequency. Once the stone is located here it often passes into the bladder and the pain disappears completely. °TREATMENT  °· Your caregiver will provide you with medicine for pain relief. °· You may require specialized follow-up X-rays. °· The absence of pain does not always mean that the stone has passed. It may have just stopped moving. If the urine remains completely obstructed, it can cause loss of kidney function or even complete destruction of the involved kidney. It is your responsibility and in your interest that X-rays and follow-ups as suggested by your caregiver are completed. Relief of pain without passage of the stone can be associated with severe damage to the kidney, including loss of kidney function on that side. °· If your stone does not pass on its own, additional measures may be taken by your caregiver to ensure its removal. °HOME CARE INSTRUCTIONS  °· Increase your fluid intake. Water is the preferred fluid since juices containing vitamin C may acidify the urine making it  less likely for certain stones (uric acid stones) to pass. °· Strain all urine. A strainer will be provided. Keep all particulate matter or stones for your caregiver to inspect. °· Take your pain medicine as directed. °· Make a follow-up appointment with your caregiver as directed. °· Remember that the goal is passage of your stone. The absence of pain does not mean the stone is gone. Follow your caregiver's instructions. °· Only take over-the-counter or prescription medicines for pain, discomfort, or fever as directed by your caregiver. °SEEK MEDICAL CARE IF:  °· Pain cannot be controlled with the prescribed medicine. °· You have a fever. °· Pain continues for longer than your caregiver advises it should. °· There is a change in the pain, and you develop chest discomfort or constant abdominal pain. °· You feel faint or pass out. °MAKE SURE YOU:  °· Understand these instructions. °· Will watch your condition. °· Will get help right away if you are not doing well or get worse. °Document Released: 02/20/2005 Document Revised: 09/07/2012 Document Reviewed: 11/07/2010 °ExitCare® Patient Information ©2014 [employer], LLC. ° °

## 2013-10-30 NOTE — ED Notes (Signed)
Pt c/o rt flank pain radiating to RLQ; pain on urination, off and on x70month

## 2013-10-30 NOTE — ED Provider Notes (Signed)
CSN: [id number]     Arrival date & time 10/30/13  0241 History   First MD Initiated Contact with Patient 10/30/13 0256     Chief Complaint  Patient presents with  . Flank Pain     (Consider location/radiation/quality/duration/timing/severity/associated sxs/prior Treatment) HPI This is a 28 year old female with a history of diabetes. She is here with right flank pain that began about an hour ago. It is in her lower right flank and radiating to her right lower quadrant and suprapubic region. She rates moderate to severe. It is worse with movement or palpation. It is associated with nausea but no vomiting. She is having pain with urination which she does not describe as burning. She has had similar episodes in the past and has been told that she had a urinary tract infection or possibly a kidney stone but has never had a CT scan to evaluate for kidney stone.  Past Medical History  Diagnosis Date  . Diabetes mellitus   . GERD (gastroesophageal reflux disease)   . Acne   . Plantar fasciitis    Past Surgical History  Procedure Laterality Date  . Appendectomy    . Cholecystectomy     Family History  Problem Relation Age of Onset  . Hyperlipidemia Mother   . Hypertension Mother   . Cancer Mother     Laryngeal Cancer  . GER disease Mother   . Endometriosis Mother   . Anxiety disorder Mother   . Diabetes Father   . Congestive Heart Failure Father     Viral Cardiomyopathy  . Hyperlipidemia Father   . Cancer Father   . Heart disease Maternal Grandmother   . Hypertension Maternal Grandmother   . Heart disease Maternal Grandfather   . Hypertension Maternal Grandfather   . Hyperlipidemia Maternal Grandfather   . Heart disease Paternal Grandmother   . Heart disease Paternal Grandfather    History  Substance Use Topics  . Smoking status: Never Smoker   . Smokeless tobacco: Never Used  . Alcohol Use: No   OB History   Grav Para Term Preterm Abortions TAB SAB Ect Mult Living                  Review of Systems  All other systems reviewed and are negative.   Allergies  Review of patient's allergies indicates no known allergies.  Home Medications   Prior to Admission medications   Medication Sig Start Date End Date Taking? Authorizing Provider  Exenatide ER (BYDUREON) 2 MG PEN Inject 1 pen into the skin once a week. 07/12/13 07/12/14 Yes Gillian Scarceobyn K Zanard, MD  Exenatide ER (BYDUREON) 2 MG SUSR Inject 2 mg into the skin once a week. 07/12/13 07/12/14 Yes Gillian Scarceobyn K Zanard, MD  metFORMIN (GLUCOPHAGE XR) 500 MG 24 hr tablet Take up to 4 tabs per day 01/06/13 01/06/14 Yes Gillian Scarceobyn K Zanard, MD  NUVARING 0.12-0.015 MG/24HR vaginal ring  10/10/12  Yes Historical Provider, MD  pantoprazole (PROTONIX) 40 MG tablet Take 1 tablet (40 mg total) by mouth daily. 07/12/13 07/12/14 Yes Gillian Scarceobyn K Zanard, MD  Canagliflozin (INVOKANA) 300 MG TABS Take 1 tablet (300 mg total) by mouth daily. 01/06/13 01/06/14  Gillian Scarceobyn K Zanard, MD  Canagliflozin-Metformin HCl 150-500 MG TABS Take 1 tablet by mouth 2 (two) times daily. 07/12/13 07/12/14  Gillian Scarceobyn K Zanard, MD   BP 133/85  Pulse 92  Temp(Src) 98.3 F (36.8 C) (Oral)  Resp 18  SpO2 100%  LMP 09/29/2013  Physical Exam General: Well-developed,  well-nourished female in no acute distress; appearance consistent with age of record HENT: normocephalic; atraumatic Eyes: pupils equal, round and reactive to light; extraocular muscles intact Neck: supple Heart: regular rate and rhythm Lungs: clear to auscultation bilaterally Abdomen: soft; nondistended; suprapubic tenderness which also causes pain to be felt in the right lower back; no masses or hepatosplenomegaly; bowel sounds present GU: Right CVA tenderness Extremities: No deformity; full range of motion; pulses normal; trace edema of lower legs Neurologic: Awake, alert and oriented; motor function intact in all extremities and symmetric; no facial droop Skin: Warm and dry Psychiatric: Normal mood and  affect    ED Course  Procedures (including critical care time)  MDM   Nursing notes and vitals signs, including pulse oximetry, reviewed.  Summary of this visit's results, reviewed by myself:  Labs:  Results for orders placed during the hospital encounter of 10/30/13 (from the past 24 hour(s))  PREGNANCY, URINE     Status: None   Collection Time    10/30/13  3:55 AM      Result Value Ref Range   Preg Test, Ur NEGATIVE  NEGATIVE  URINALYSIS, ROUTINE W REFLEX MICROSCOPIC     Status: Abnormal   Collection Time    10/30/13  3:55 AM      Result Value Ref Range   Color, Urine AMBER (*) YELLOW   APPearance CLOUDY (*) CLEAR   Specific Gravity, Urine 1.027  1.005 - 1.030   pH 5.0  5.0 - 8.0   Glucose, UA NEGATIVE  NEGATIVE mg/dL   Hgb urine dipstick LARGE (*) NEGATIVE   Bilirubin Urine NEGATIVE  NEGATIVE   Ketones, ur 15 (*) NEGATIVE mg/dL   Protein, ur 960 (*) NEGATIVE mg/dL   Urobilinogen, UA 0.2  0.0 - 1.0 mg/dL   Nitrite NEGATIVE  NEGATIVE   Leukocytes, UA SMALL (*) NEGATIVE  URINE MICROSCOPIC-ADD ON     Status: Abnormal   Collection Time    10/30/13  3:55 AM      Result Value Ref Range   Squamous Epithelial / LPF FEW (*) RARE   WBC, UA 3-6  <3 WBC/hpf   RBC / HPF TOO NUMEROUS TO COUNT  <3 RBC/hpf   Bacteria, UA FEW (*) RARE    Imaging Studies: Ct Abdomen Pelvis Wo Contrast  10/30/2013   CLINICAL DATA:  right flank pain; hematuria  EXAM: CT ABDOMEN AND PELVIS WITHOUT CONTRAST  TECHNIQUE: Multidetector CT imaging of the abdomen and pelvis was performed following the standard protocol without IV contrast.  COMPARISON:  None available  FINDINGS: The visualized lung bases are clear.  Limited noncontrast evaluation of the liver is unremarkable. Gallbladder is surgically absent. No biliary ductal dilatation. The spleen, adrenal glands, and pancreas demonstrate a normal unenhanced appearance.  The left kidney is unremarkable without evidence of nephrolithiasis or hydronephrosis. No  stones seen along the course of the left renal collecting system.  On the right, there is an obstructive 4 mm calculus within the mid right ureter (series 2, image 53). There is secondary mild right hydroureteronephrosis with mild periureteral fat stranding. No other stones seen within the right kidney or distally within the right renal collecting system.  Stomach is within normal limits. No evidence of bowel obstruction. No acute inflammatory changes seen about the bowels. Appendix is normal.  Bladder is decompressed and not well evaluated. Uterus and ovaries within normal limits.  No free air or fluid. No enlarged intra-abdominal pelvic lymph nodes.  No acute osseous abnormality. No  worrisome lytic or blastic osseous lesions.  IMPRESSION: 1. 4 mm obstructive stone within the mid right ureter with secondary mild right hydroureteronephrosis. 2. No other acute intra-abdominal pelvic abnormality. Normal appendix.   Electronically Signed   By: Rise Mu M.D.   On: 10/30/2013 05:00   5:47 AM Patient's pain and nausea now well controlled. Urinalysis is not diagnostic of a UTI but we will send it for culture as a precaution. Patient was advised a 4 mm stone was about 80% likely to pass on its own but we will refer her to urology should her symptoms worsen or her stone failed to pass.     Hanley Seamen, MD 10/30/13 [phone]

## 2013-10-31 LAB — URINE CULTURE

## 2013-12-02 ENCOUNTER — Other Ambulatory Visit: Payer: Self-pay | Admitting: Urology

## 2013-12-02 ENCOUNTER — Encounter (HOSPITAL_BASED_OUTPATIENT_CLINIC_OR_DEPARTMENT_OTHER): Payer: Self-pay | Admitting: *Deleted

## 2013-12-02 NOTE — Progress Notes (Addendum)
NPO AFTER MN WITH EXCEPTION CLEAR LIQUIDS UNTIL 0800 (NO CREAM/ MILK PRODUCTS). ARRIVE AT 1230. NEEDS ISTAT , URINE PREG. AND EKG. WILL TAKE PROTONIX AND BCP AM DOS W/ SIPS OF WATER AND IF NEEDED TAKE PAIN/ NAUSEA RX.

## 2013-12-03 ENCOUNTER — Encounter (HOSPITAL_BASED_OUTPATIENT_CLINIC_OR_DEPARTMENT_OTHER): Payer: BC Managed Care – PPO | Admitting: Anesthesiology

## 2013-12-03 ENCOUNTER — Ambulatory Visit (HOSPITAL_BASED_OUTPATIENT_CLINIC_OR_DEPARTMENT_OTHER): Payer: BC Managed Care – PPO | Admitting: Anesthesiology

## 2013-12-03 ENCOUNTER — Encounter (HOSPITAL_BASED_OUTPATIENT_CLINIC_OR_DEPARTMENT_OTHER)
Admission: RE | Disposition: A | Discharge status: Home / Self Care | Payer: Self-pay | Source: Ambulatory Visit | Attending: Urology

## 2013-12-03 ENCOUNTER — Other Ambulatory Visit: Payer: Self-pay

## 2013-12-03 ENCOUNTER — Encounter (HOSPITAL_BASED_OUTPATIENT_CLINIC_OR_DEPARTMENT_OTHER): Payer: Self-pay

## 2013-12-03 ENCOUNTER — Ambulatory Visit (HOSPITAL_BASED_OUTPATIENT_CLINIC_OR_DEPARTMENT_OTHER)
Admission: RE | Admit: 2013-12-03 | Discharge: 2013-12-03 | Disposition: A | Discharge status: Home / Self Care | Payer: BC Managed Care – PPO | Source: Ambulatory Visit | Attending: Urology | Admitting: Urology

## 2013-12-03 DIAGNOSIS — E119 Type 2 diabetes mellitus without complications: Secondary | ICD-10-CM | POA: Insufficient documentation

## 2013-12-03 DIAGNOSIS — Z79899 Other long term (current) drug therapy: Secondary | ICD-10-CM | POA: Insufficient documentation

## 2013-12-03 DIAGNOSIS — K219 Gastro-esophageal reflux disease without esophagitis: Secondary | ICD-10-CM | POA: Insufficient documentation

## 2013-12-03 DIAGNOSIS — N201 Calculus of ureter: Secondary | ICD-10-CM | POA: Insufficient documentation

## 2013-12-03 HISTORY — PX: HOLMIUM LASER APPLICATION: SHX5852

## 2013-12-03 HISTORY — DX: Type 2 diabetes mellitus without complications: E11.9

## 2013-12-03 HISTORY — DX: Frequency of micturition: R35.0

## 2013-12-03 HISTORY — DX: Urgency of urination: R39.15

## 2013-12-03 HISTORY — DX: Personal history of other specified conditions: Z87.898

## 2013-12-03 HISTORY — PX: CYSTOSCOPY WITH RETROGRADE PYELOGRAM, URETEROSCOPY AND STENT PLACEMENT: SHX5789

## 2013-12-03 HISTORY — DX: Calculus of ureter: N20.1

## 2013-12-03 HISTORY — DX: Polycystic ovarian syndrome: E28.2

## 2013-12-03 HISTORY — DX: Presence of spectacles and contact lenses: Z97.3

## 2013-12-03 LAB — POCT I-STAT 4, (NA,K, GLUC, HGB,HCT)
Glucose, Bld: 80 mg/dL (ref 70–99)
HCT: 37 % (ref 36.0–46.0)
HEMOGLOBIN: 12.6 g/dL (ref 12.0–15.0)
Potassium: 3.9 mEq/L (ref 3.7–5.3)
SODIUM: 142 meq/L (ref 137–147)

## 2013-12-03 LAB — GLUCOSE, CAPILLARY: GLUCOSE-CAPILLARY: 85 mg/dL (ref 70–99)

## 2013-12-03 LAB — POCT PREGNANCY, URINE: Preg Test, Ur: NEGATIVE

## 2013-12-03 SURGERY — CYSTOURETEROSCOPY, WITH RETROGRADE PYELOGRAM AND STENT INSERTION
Anesthesia: General | Site: Ureter | Laterality: Right

## 2013-12-03 MED ORDER — PROMETHAZINE HCL 25 MG/ML IJ SOLN
6.2500 mg | INTRAMUSCULAR | Status: DC | PRN
  Filled 2013-12-03: qty 1

## 2013-12-03 MED ORDER — FENTANYL CITRATE 0.05 MG/ML IJ SOLN
INTRAMUSCULAR | Status: DC | PRN
  Administered 2013-12-03: 50 ug via INTRAVENOUS
  Administered 2013-12-03 (×6): 25 ug via INTRAVENOUS

## 2013-12-03 MED ORDER — OXYCODONE HCL 5 MG PO TABS
5.0000 mg | ORAL_TABLET | Freq: Once | ORAL | Status: AC | PRN
  Administered 2013-12-03: 5 mg via ORAL
  Filled 2013-12-03: qty 1

## 2013-12-03 MED ORDER — LACTATED RINGERS IV SOLN
INTRAVENOUS | Status: DC | PRN
  Administered 2013-12-03 (×2): via INTRAVENOUS

## 2013-12-03 MED ORDER — LACTATED RINGERS IV SOLN
INTRAVENOUS | Status: DC
  Administered 2013-12-03: 14:00:00 via INTRAVENOUS
  Filled 2013-12-03: qty 1000

## 2013-12-03 MED ORDER — OXYCODONE HCL 5 MG PO TABS
ORAL_TABLET | ORAL | Status: AC
  Filled 2013-12-03: qty 1

## 2013-12-03 MED ORDER — FENTANYL CITRATE 0.05 MG/ML IJ SOLN
INTRAMUSCULAR | Status: AC
  Filled 2013-12-03: qty 4

## 2013-12-03 MED ORDER — LIDOCAINE HCL (CARDIAC) 20 MG/ML IV SOLN
INTRAVENOUS | Status: DC | PRN
Start: 2013-12-03 — End: 2013-12-03
  Administered 2013-12-03: 100 mg via INTRAVENOUS

## 2013-12-03 MED ORDER — ONDANSETRON HCL 4 MG/2ML IJ SOLN
INTRAMUSCULAR | Status: DC | PRN
  Administered 2013-12-03: 4 mg via INTRAVENOUS

## 2013-12-03 MED ORDER — LIDOCAINE HCL 2 % EX GEL
CUTANEOUS | Status: DC | PRN
  Administered 2013-12-03: 1 via URETHRAL

## 2013-12-03 MED ORDER — OXYCODONE HCL 5 MG/5ML PO SOLN
5.0000 mg | Freq: Once | ORAL | Status: AC | PRN
  Filled 2013-12-03: qty 5

## 2013-12-03 MED ORDER — CEFAZOLIN SODIUM-DEXTROSE 2-3 GM-% IV SOLR
2.0000 g | INTRAVENOUS | Status: AC
  Administered 2013-12-03: 2 g via INTRAVENOUS
  Filled 2013-12-03: qty 50

## 2013-12-03 MED ORDER — ACETAMINOPHEN 10 MG/ML IV SOLN
INTRAVENOUS | Status: DC | PRN
  Administered 2013-12-03: 1000 mg via INTRAVENOUS

## 2013-12-03 MED ORDER — MIDAZOLAM HCL 2 MG/2ML IJ SOLN
INTRAMUSCULAR | Status: AC
  Filled 2013-12-03: qty 2

## 2013-12-03 MED ORDER — SODIUM CHLORIDE 0.9 % IR SOLN
Status: DC | PRN
  Administered 2013-12-03: 6000 mL

## 2013-12-03 MED ORDER — PROPOFOL 10 MG/ML IV BOLUS
INTRAVENOUS | Status: DC | PRN
  Administered 2013-12-03: 100 mg via INTRAVENOUS
  Administered 2013-12-03: 200 mg via INTRAVENOUS

## 2013-12-03 MED ORDER — KETOROLAC TROMETHAMINE 30 MG/ML IJ SOLN
INTRAMUSCULAR | Status: DC | PRN
  Administered 2013-12-03: 30 mg via INTRAVENOUS

## 2013-12-03 MED ORDER — GLYCOPYRROLATE 0.2 MG/ML IJ SOLN
INTRAMUSCULAR | Status: DC | PRN
  Administered 2013-12-03: 0.2 mg via INTRAVENOUS

## 2013-12-03 MED ORDER — METOCLOPRAMIDE HCL 5 MG/ML IJ SOLN
INTRAMUSCULAR | Status: DC | PRN
  Administered 2013-12-03: 10 mg via INTRAVENOUS

## 2013-12-03 MED ORDER — MEPERIDINE HCL 25 MG/ML IJ SOLN
6.2500 mg | INTRAMUSCULAR | Status: DC | PRN
  Filled 2013-12-03: qty 1

## 2013-12-03 MED ORDER — MIDAZOLAM HCL 5 MG/5ML IJ SOLN
INTRAMUSCULAR | Status: DC | PRN
  Administered 2013-12-03 (×2): 1 mg via INTRAVENOUS

## 2013-12-03 MED ORDER — HYDROMORPHONE HCL PF 1 MG/ML IJ SOLN
0.2500 mg | INTRAMUSCULAR | Status: DC | PRN
  Filled 2013-12-03: qty 1

## 2013-12-03 SURGICAL SUPPLY — 33 items
ADAPTER CATH URET PLST 4-6FR (CATHETERS) IMPLANT
BAG DRAIN URO-CYSTO SKYTR STRL (DRAIN) ×3 IMPLANT
BASKET LASER NITINOL 1.9FR (BASKET) IMPLANT
BASKET STNLS GEMINI 4WIRE 3FR (BASKET) IMPLANT
BASKET ZERO TIP NITINOL 2.4FR (BASKET) ×3 IMPLANT
BENZOIN TINCTURE PRP APPL 2/3 (GAUZE/BANDAGES/DRESSINGS) IMPLANT
CANISTER SUCT LVC 12 LTR MEDI- (MISCELLANEOUS) IMPLANT
CATH INTERMIT  6FR 70CM (CATHETERS) IMPLANT
CATH URET 5FR 28IN CONE TIP (BALLOONS)
CATH URET 5FR 28IN OPEN ENDED (CATHETERS) ×3 IMPLANT
CATH URET 5FR 70CM CONE TIP (BALLOONS) IMPLANT
CLOTH BEACON ORANGE TIMEOUT ST (SAFETY) ×3 IMPLANT
DRAPE CAMERA CLOSED 9X96 (DRAPES) ×3 IMPLANT
DRSG TEGADERM 2-3/8X2-3/4 SM (GAUZE/BANDAGES/DRESSINGS) IMPLANT
FIBER LASER FLEXIVA 1000 (UROLOGICAL SUPPLIES) IMPLANT
FIBER LASER FLEXIVA 200 (UROLOGICAL SUPPLIES) IMPLANT
FIBER LASER FLEXIVA 365 (UROLOGICAL SUPPLIES) ×3 IMPLANT
FIBER LASER FLEXIVA 550 (UROLOGICAL SUPPLIES) IMPLANT
GLOVE BIO SURGEON STRL SZ7.5 (GLOVE) ×3 IMPLANT
GOWN STRL REIN XL XLG (GOWN DISPOSABLE) ×3 IMPLANT
GUIDEWIRE 0.038 PTFE COATED (WIRE) IMPLANT
GUIDEWIRE ANG ZIPWIRE 038X150 (WIRE) IMPLANT
GUIDEWIRE STR DUAL SENSOR (WIRE) ×3 IMPLANT
IV NS IRRIG 3000ML ARTHROMATIC (IV SOLUTION) ×6 IMPLANT
KIT BALLIN UROMAX 15FX10 (LABEL) IMPLANT
KIT BALLN UROMAX 15FX4 (MISCELLANEOUS) IMPLANT
KIT BALLN UROMAX 26 75X4 (MISCELLANEOUS)
LASER FIBER DISP (UROLOGICAL SUPPLIES) IMPLANT
NS IRRIG 500ML POUR BTL (IV SOLUTION) IMPLANT
PACK CYSTOSCOPY (CUSTOM PROCEDURE TRAY) ×3 IMPLANT
SET HIGH PRES BAL DIL (LABEL)
SHEATH ACCESS URETERAL 38CM (SHEATH) IMPLANT
SHEATH ACCESS URETERAL 54CM (SHEATH) IMPLANT

## 2013-12-03 NOTE — Anesthesia Procedure Notes (Signed)
Procedure Name: LMA Insertion Date/Time: 12/03/2013 2:04 PM Performed by: Jessica PriestBEESON, Samantha Candella C Pre-anesthesia Checklist: Patient identified, Emergency Drugs available, Suction available and Patient being monitored Patient Re-evaluated:Patient Re-evaluated prior to inductionOxygen Delivery Method: Circle System Utilized Preoxygenation: Pre-oxygenation with 100% oxygen Intubation Type: IV induction Ventilation: Mask ventilation without difficulty LMA: LMA inserted LMA Size: 4.0 Number of attempts: 1 Airway Equipment and Method: bite block Placement Confirmation: positive ETCO2 Tube secured with: Tape Dental Injury: Teeth and Oropharynx as per pre-operative assessment

## 2013-12-03 NOTE — Discharge Instructions (Signed)

## 2013-12-03 NOTE — Interval H&P Note (Signed)
History and Physical Interval Note:  12/03/2013 1:58 PM  Samantha Mitchell Senters  has presented today for surgery, with the diagnosis of Right Ureteral Calculus  The various methods of treatment have been discussed with the patient and family. After consideration of risks, benefits and other options for treatment, the patient has consented to  Procedure(s) with comments: CYSTOSCOPY WITH RETROGRADE PYELOGRAM, URETEROSCOPY AND STENT PLACEMENT (Right) HOLMIUM LASER APPLICATION (Right) - POSSIBLE HOLMIUM LASER as a surgical intervention .  The patient's history has been reviewed, patient examined, no change in status, stable for surgery.  I have reviewed the patient's chart and labs.  Questions were answered to the patient's satisfaction.     Kimbely Whiteaker S

## 2013-12-03 NOTE — H&P (Signed)
Reason For Visit Samantha Mitchell presents today to discuss treatment options on a more urgent basis for her distal ureteral calculus. She has had a ureteral stone now for approximately 1 month. Her symptoms have been intermittent. She was reassessed yesterday and felt to still have evidence of a distal stone. She was insistent on treatment ASAP.  She was given appointment today with me to discuss those options. Her history over the last several visits is summarized in the section below. I reviewed these notes and imaging studies.   History of Present Illness   Recent urologic history from last several visits:          Samantha Mitchell is a new patient of Dr. Gaynelle Mitchell seen last month for right flank pain. A CT showed a 18m Rt mid ureteral stone with mild hydronephrosis. She is currently taking tamsulosin daily to aid in MET of this stone.    Interval history: Samantha Mitchell today with ongoing right flank pain. Apparently this had subsided for a while and she thought she had passed her stone but resumed in the last day or so. Nothing makes it better or worse. It is associated with nausea. She denies LUTS or gross hematuria. She has not had a fever.     Per KUB it seems Samantha Mitchell not passed her stone which I would have assumed based upon her symptoms and RBCs per UA. Had she not discontinued her tamsulosin due to lack of symptoms she may have passed this stone which she understands. She reports that in the past 12 hours she has had difficulty controlling her symptoms with oral medication however after an injection of ketorolac 60 mg IM today was pain free in our office. I have suggested resuming tamsulosin as she reports she likely has not taken this in 2-3 weeks. I do think she would have a good chance of passing this stone and we can alter her pain and nausea medication to increase the likelihood that it would control her symptoms. Samantha Mitchell that she does not wish to continue MET and  would like this stone removed. We discussed ESWL vs ureteroscopy and I think she would probably be a candidate for ureteroscopy. I told her that I can discuss this with Dr. TGaynelle Arabianwhen he returns Friday and try to set her up next week. Ms. MMoxleyis insistent upon having something done by this week's end. She understands that since her symptoms are actually controlled and she is not febrile this is not an emergency and it would not be appropriate to contact the on call MD today for this procedure. She is absolutely adamant something be done and is not willing to wait for Dr. TGaynelle Mitchell I have suggested that if she feels this way she make an appointment with the on call MD for later in the week and he can determine if she should go to the OR immediately or if she can wait for Dr. TGaynelle Mitchell In the meantime she will resume tamsulosin. She will notify our office of an acute change such as pain or n/v uncontrolled with medication or a temp of 100.5 or greater. Again in our office she is symptom free.   Past Medical History Problems  1. History of Anxiety (300.00) 2. History of depression (V11.8) 3. History of diabetes mellitus (V12.29) 4. History of esophageal reflux (V12.79) 5. History of Plantar fasciitis (728.71)  Surgical History Problems  1. History of Appendectomy 2. History of Cholecystectomy 3. History of Exploratory Laparotomy  Current Meds 1. Birth Control Pill;  Therapy: (Recorded:08Jul2015) to Recorded 2. Bydureon 2 MG Subcutaneous Pen-injector;  Therapy: (Recorded:09Jun2015) to Recorded 3. Ketorolac Tromethamine 10 MG Oral Tablet; Take 1 tablet every 6 hours;  Therapy: [phone] to (Last Rx:17Jun2015) Ordered 4. MetFORMIN HCl ER 500 MG Oral Tablet Extended Release 24 Hour;  Therapy: (GYIRSWNI:62VOJ5009) to Recorded 5. Naproxen Sodium 220 MG Oral Tablet;  Therapy: (FGHWEXHB:71IRC7893) to Recorded 6. Oxycodone-Acetaminophen 10-325 MG Oral Tablet;  Therapy:  (YBOFBPZW:25ENI7782) to Recorded 7. Promethazine HCl - 25 MG Oral Tablet;  Therapy: (UMPNTIRW:43XVQ0086) to Recorded 8. Tamsulosin HCl - 0.4 MG Oral Capsule; TAKE 2 CAPSULE Bedtime;  Therapy: [id number] to (Evaluate:07Aug2015); Last Rx:08Jul2015 Ordered 9. Tamsulosin HCl - 0.4 MG Oral Capsule;  Therapy: (Recorded:09Jun2015) to Recorded  Allergies Medication  1. No Known Drug Allergies  Family History Problems  1. Family history of Anxiety : Mother 2. Family history of cardiac disorder (V17.49) : Maternal Grandmother, Paternal  73, Maternal Grandfather, Paternal Grandfather 3. Family history of colon cancer (V16.0) : Father 4. Family history of congestive heart failure (V17.49) : Father 5. Family history of diabetes mellitus (V18.0) : Father 6. Family history of endometriosis (V19.8) : Mother 7. Family history of gastroesophageal reflux disease (V18.59) : Mother 8. Family history of hypercholesterolemia (V18.19) : Mother, Father, Maternal Grandfather 28. Family history of hypertension (V17.49) : Mother, Maternal Grandmother, Maternal  Grandfather 87. Family history of kidney stones (V18.69) : Mother, Father 23. Family history of malignant neoplasm of larynx (V16.2) : Mother  Social History Problems  1. Caffeine use (V49.89)   2 per day 2. Never a smoker 3. No alcohol use 4. Occupation   Call center agent 5. Single  Review of Systems Genitourinary, constitutional, skin, eye, otolaryngeal, hematologic/lymphatic, cardiovascular, pulmonary, endocrine, musculoskeletal, gastrointestinal, neurological and psychiatric system(s) were reviewed and pertinent findings if present are noted.  Genitourinary: urinary frequency.  Gastrointestinal: nausea, flank pain and abdominal pain.    Vitals Vital Signs [Data Includes: Last 1 Day]  Recorded: [id number] 08:50AM  Blood Pressure: 112 / 77 Temperature: 98.2 F Heart Rate: 99 Recorded: 98PJA2505 08:46AM  Blood Pressure: 129 /  82 Temperature: 98.2 F Heart Rate: 108  Physical Exam Constitutional: Well nourished and well developed . No acute distress.  Neck: The appearance of the neck is normal and no neck mass is present.  Pulmonary: No respiratory distress and normal respiratory rhythm and effort.  Cardiovascular: Heart rate and rhythm are normal . No peripheral edema.  Abdomen: The abdomen is soft and nontender. No masses are palpated. No CVA tenderness. No hernias are palpable. No hepatosplenomegaly noted.  Skin: Normal skin turgor, no visible rash and no visible skin lesions.  Neuro/Psych:. Mood and affect are appropriate.    Assessment    Discussion/Summary   Presumptively Jerzie still has a right ureteral stone. She has undergone 2 CT images that have revealed continued presence of a ureteral calculus. She has been attempting to pass the stone for approximately 1 month. She is currently not experiencing any symptoms or clinical situation that would require urgent intervention. She is, however, continued to have intermittent pain and would like to proceed with definitive intervention as soon as possible. Given the faint nature of the stone and its distal location in a young female, I feel strongly that the best option is ureteroscopy with basket extraction and/or Holmium laser lithotripsy. She understands there is approximately a 50% chance of requiring a double-J stent. If one is placed, hopefully it would only need to be  indwelling for 3-5 days. We talked about success rates of the surgery, risks, benefits. We will do our best to try to get her on the schedule for tomorrow at sometime as a walk-in appointment at [facility].

## 2013-12-03 NOTE — Transfer of Care (Signed)
Immediate Anesthesia Transfer of Care Note  Patient: Samantha RaisinSarah Boulet  Procedure(s) Performed: Procedure(s) (LRB): CYSTOSCOPY WITH RETROGRADE PYELOGRAM, URETEROSCOPY AND STENT PLACEMENT (Right) HOLMIUM LASER APPLICATION (Right)  Patient Location: PACU  Anesthesia Type: General  Level of Consciousness: awake, sedated, patient cooperative and responds to stimulation  Airway & Oxygen Therapy: Patient Spontanous Breathing and Patient connected to face mask oxygen  Post-op Assessment: Report given to PACU RN, Post -op Vital signs reviewed and stable and Patient moving all extremities  Post vital signs: Reviewed and stable  Complications: No apparent anesthesia complications

## 2013-12-03 NOTE — Anesthesia Preprocedure Evaluation (Signed)
Anesthesia Evaluation  Patient identified by MRN, date of birth, ID band Patient awake    Reviewed: Allergy & Precautions, H&P , NPO status , Patient's Chart, lab work & pertinent test results  Airway Mallampati: II TM Distance: >3 FB Neck ROM: Full    Dental   Pulmonary neg pulmonary ROS,  breath sounds clear to auscultation        Cardiovascular negative cardio ROS  Rhythm:Regular Rate:Normal     Neuro/Psych negative neurological ROS  negative psych ROS   GI/Hepatic Neg liver ROS, GERD-  Medicated,  Endo/Other  negative endocrine ROSdiabetes, Type 2, Oral Hypoglycemic Agents  Renal/GU negative Renal ROS     Musculoskeletal negative musculoskeletal ROS (+)   Abdominal   Peds  Hematology negative hematology ROS (+)   Anesthesia Other Findings   Reproductive/Obstetrics negative OB ROS                           Anesthesia Physical Anesthesia Plan  ASA: III  Anesthesia Plan: General   Post-op Pain Management:    Induction: Intravenous  Airway Management Planned: LMA  Additional Equipment:   Intra-op Plan:   Post-operative Plan: Extubation in OR  Informed Consent: I have reviewed the patients History and Physical, chart, labs and discussed the procedure including the risks, benefits and alternatives for the proposed anesthesia with the patient or authorized representative who has indicated his/her understanding and acceptance.   Dental advisory given  Plan Discussed with: CRNA  Anesthesia Plan Comments:         Anesthesia Quick Evaluation

## 2013-12-03 NOTE — Op Note (Signed)
Preoperative diagnosis: Right ureteral calculus Postoperative diagnosis: Same  Procedure: Cystoscopy, ureteroscopy, holmium laser lithotripsy, basket of fragments   Surgeon: Valetta Fulleravid S. Sloan Galentine M.D.  Anesthesia: Gen.  Indications: A she developed right flank discomfort approximately a month ago was diagnosed with a 4 mm right proximal ureteral calculus. She has been attempting to pass the stone for approximately a month. I saw her yesterday and she requested definitive intervention. She appeared to understand the advantages disadvantages of the various approaches. She presents now for an attempt a ureteroscopy.     Technique and findings: Patient was brought the operating room where she had successful induction general anesthesia. She was placed in lithotomy position and prepped and draped in usual manner. She received perioperative antibiotics. Appropriate surgical timeout was performed. Cystoscopy was then performed. The urethra and bladder were unremarkable with the exception of some mild edema on the right hemitrigone ureteral orifice. A stone could be seen into the orifice. For that reason retrograde pyelography was not necessary. A guidewire was placed beyond the stone with fluoroscopy directed to the right renal pelvis. The distal ureter was then engaged with a 4 French ureteroscope. A 4 mm stone was encountered. It was free-floating within the intermural ureter. Holmium laser lithotripter fiber was utilized at 0.5 J x5 Hz to fracture the stone into approximately a dozen pieces. The largest 2 little pieces were basket extracted for stone analysis. Given the lack of ureteral dilation and the lack of obvious gross inflammation we felt she would do okay without double-J stent placement. The guidewire was removed. Her bladder was emptied lidocaine jelly was instilled. The patient will receive Toradol. She was brought to recovery room stable condition having had no obvious complications or problems.

## 2013-12-06 NOTE — Anesthesia Postprocedure Evaluation (Signed)
  Anesthesia Post-op Note  Patient: Samantha RaisinSarah Mitchell  Procedure(s) Performed: Procedure(s) (LRB): CYSTOSCOPY WITH RETROGRADE PYELOGRAM, URETEROSCOPY AND STENT PLACEMENT (Right) HOLMIUM LASER APPLICATION (Right)  Patient Location: PACU  Anesthesia Type: General  Level of Consciousness: awake and alert   Airway and Oxygen Therapy: Patient Spontanous Breathing  Post-op Pain: mild  Post-op Assessment: Post-op Vital signs reviewed, Patient's Cardiovascular Status Stable, Respiratory Function Stable, Patent Airway and No signs of Nausea or vomiting  Last Vitals:  Filed Vitals:   12/03/13 1630  BP: 125/84  Pulse: 90  Temp: 36.4 C  Resp: 18    Post-op Vital Signs: stable   Complications: No apparent anesthesia complications

## 2013-12-07 ENCOUNTER — Encounter (HOSPITAL_BASED_OUTPATIENT_CLINIC_OR_DEPARTMENT_OTHER): Payer: Self-pay | Admitting: Urology

## 2013-12-28 ENCOUNTER — Encounter: Payer: Self-pay | Admitting: Family Medicine

## 2013-12-28 ENCOUNTER — Ambulatory Visit (INDEPENDENT_AMBULATORY_CARE_PROVIDER_SITE_OTHER): Payer: BC Managed Care – PPO | Admitting: Family Medicine

## 2013-12-28 VITALS — HR 103 | Resp 16 | Ht 65.0 in | Wt 249.0 lb

## 2013-12-28 DIAGNOSIS — K219 Gastro-esophageal reflux disease without esophagitis: Secondary | ICD-10-CM

## 2013-12-28 DIAGNOSIS — IMO0001 Reserved for inherently not codable concepts without codable children: Secondary | ICD-10-CM

## 2013-12-28 DIAGNOSIS — E669 Obesity, unspecified: Secondary | ICD-10-CM

## 2013-12-28 DIAGNOSIS — E1165 Type 2 diabetes mellitus with hyperglycemia: Principal | ICD-10-CM

## 2013-12-28 LAB — POCT GLYCOSYLATED HEMOGLOBIN (HGB A1C): Hemoglobin A1C: 5.8

## 2013-12-28 MED ORDER — PHENTERMINE-TOPIRAMATE ER 7.5-46 MG PO CP24: 1.0000 | ORAL_CAPSULE | Freq: Every day | ORAL | Status: AC

## 2013-12-28 MED ORDER — PANTOPRAZOLE SODIUM 40 MG PO TBEC: 40.0000 mg | DELAYED_RELEASE_TABLET | Freq: Every morning | ORAL | Status: AC

## 2013-12-28 MED ORDER — METFORMIN HCL ER 500 MG PO TB24: 1000.0000 mg | ORAL_TABLET | Freq: Two times a day (BID) | ORAL | Status: AC

## 2013-12-28 MED ORDER — PHENTERMINE-TOPIRAMATE ER 3.75-23 MG PO CP24: 1.0000 | ORAL_CAPSULE | Freq: Every day | ORAL | Status: AC

## 2013-12-28 NOTE — Patient Instructions (Signed)
1)  Blood Sugar - Your A1c remains good.  Go ahead and hold on the Bydureon. If your blood sugar increases, increase your metformin to 3-4 per day  2)  Weight - Try the Qsymia for 3 months    Phentermine; Topiramate extended-release capsules What is this medicine? Phentermine; topiramate (FEN ter meen; toe PYRE a mate) is a combination of two medicines used with a reduced calorie diet and exercise to help you lose weight. This medicine is only available through certified pharmacies enrolled in a special program. Your healthcare professional will tell you where you can get your medicine. If you have additional questions, you can visit the manufacture's website at www.[email or web address] or contact them by phone at [phone]. This medicine may be used for other purposes; ask your health care provider or pharmacist if you have questions. COMMON BRAND NAME(S): Qsymia What should I tell my health care provider before I take this medicine? They need to know if you have any of these conditions: -agitation -diarrhea -depression or other mental illness -diabetes -glaucoma -heart disease -high or low blood pressure -history of anorexia or other eating disorder -history of substance abuse -kidney stones or kidney disease -liver disease -lung disease like asthma, obstructive pulmonary disease, emphysema -metabolic acidosis -on a ketogenic diet -scheduled for surgery or a procedure -suicidal thoughts, plans, or attempt; a previous suicide attempt by you or a family member -taken an MAOI like Carbex, Eldepryl, Marplan, Nardil, or Parnate in last 14 days -thyroid disease -an unusual or allergic reaction to phentermine, topiramate, other medicines, foods, dyes, or preservatives -pregnant or trying to get pregnant -breast-feeding How should I use this medicine? Take this medicine by mouth with a glass of water. Follow the directions on the prescription label. Do not crush or chew. This medicine is  usually taken with or without food once per day in the morning. Avoid taking this medicine in the evening. It may interfere with sleep. Take your doses at regular intervals. Do not take your medicine more often than directed. A special MedGuide will be given to you by the pharmacist with each prescription and refill. Be sure to read this information carefully each time. Talk to your pediatrician regarding the use of this medicine in children. Special care may be needed. Overdosage: If you think you've taken too much of this medicine contact a poison control center or emergency room at once. Overdosage: If you think you have taken too much of this medicine contact a poison control center or emergency room at once. NOTE: This medicine is only for you. Do not share this medicine with others. What if I miss a dose? If you miss a dose, take it as soon as you can. If it is almost time for your next dose, take only that dose. Do not take double or extra doses. What may interact with this medicine? Do not take this medicine with any of the following medications: -MAOIs like Carbex, Eldepryl, Marplan, Nardil, and ParnateThis medicine may also interact with the following medications: -acetazolamide -amitriptyline -antihistamines for allergy, cough and cold -atropine -birth control pills -carbamazepine -certain medicines for bladder problems like oxybutynin, tolterodine -certain medicines for depression, anxiety, or psychotic disturbances -certain medicines for Parkinson's disease like benztropine, trihexyphenidyl -certain medicines for stomach problems like dicyclomine, hyoscyamine -certain medicines for travel sickness like scopolamine -dichlorphenamide -digoxin -diltiazem -diuretics -hydrochlorothiazide -ipratropium -lithium -medicines for diabetes -medicines for pain, sleep, or muscle relaxation -methazolamide -phenytoin -pioglitazone -stimulant medicines for attention disorders, weight  loss, or to  stay awake -valproic acid -zonisamide This list may not describe all possible interactions. Give your health care provider a list of all the medicines, herbs, non-prescription drugs, or dietary supplements you use. Also tell them if you smoke, drink alcohol, or use illegal drugs. Some items may interact with your medicine. What should I watch for while using this medicine? Visit your doctor or health care professional for regular checks on your progress. This medicine is intended to be used in addition to a healthy diet and appropriate exercise. The best results are achieved this way. Do not increase or in any way change your dose without consulting your doctor or health care professional. Do not take this medicine within 6 hours of bedtime. It can keep you from getting to sleep. Avoid drinks that contain caffeine and try to stick to a regular bedtime every night. Do not stop taking this medicine suddenly. This increases the risk of seizures. This medicine can decrease sweating and increase your body temperature. Watch for signs of deceased sweating or fever. Avoid extreme heat, hot baths, and saunas. Be careful about exercising, especially in hot weather. Contact your health care provider right away if you notice a fever or decrease in sweating. You should drink plenty of fluids while taking this medicine. If you have had kidney stones in the past, this will help to reduce your chances of forming kidney stones. If you have stomach pain, with nausea or vomiting and yellowing of your eyes or skin, call your doctor immediately. You may get drowsy or dizzy. Do not drive, use machinery, or do anything that needs mental alertness until you know how this medicine affects you. Do not stand or sit up quickly, especially if you are an older patient. This reduces the risk of dizzy or fainting spells. Alcohol may increase dizziness and drowsiness. Avoid alcoholic drinks. This medicine may affect blood  sugar levels. If you have diabetes, check with your doctor or health care professional before you change your diet or the dose of your diabetic medicine. Patients and their families should watch out for worsening depression or thoughts of suicide. Also watch out for sudden changes in feelings such as feeling anxious, agitated, panicky, irritable, hostile, aggressive, impulsive, severely restless, overly excited and hyperactive, or not being able to sleep. If this happens, especially at the beginning of treatment or after a change in dose, call your health care professional. If you notice blurred vision, eye pain, or other eye problems, seek medical attention at once for an eye exam. This medicine may increase the chance of developing metabolic acidosis. If left untreated, this can cause kidney stones, bone disease, or slowed growth in children. Symptoms include breathing fast, fatigue, loss of appetite, irregular heartbeat, or loss of consciousness. Call your doctor immediately if you experience any of these side effects. Also, tell your doctor about any surgery you plan on having while taking this medicine since this may increase your risk for metabolic acidosis. Women who become pregnant while using this medicine should contact their physician immediately. You should also contact The Qsymia Pregnancy Surveillance Program which is a program that monitors pregnancies that occur during treatment. Contact the program by calling [phone]. What side effects may I notice from receiving this medicine? Side effects that you should report to your doctor or health care professional as soon as possible: -allergic reactions like skin rash, itching or hives, swelling of the face, lips, or tongue -blood in the urine -changes in vision -chest pain or chest  tightness -confusion -depressed mood -difficulty breathing -dizziness -fast or irregular heartbeat -feeling anxious -irritable -loss of appetite -low  blood pressure -pain in the lower back or side -pain, tingling, numbness in the hands or feet -pain when urinating -palpitations -redness, blistering, peeling or loosening of the skin, including inside the mouth -shortness of breath -suicidal thoughts or other mood changes -trouble passing urine or change in the amount of urine -trouble walking, dizziness, loss of balance or coordination -unusually weak or tired -vomiting Side effects that usually do not require medical attention (Report these to your doctor or health care professional if they continue or are bothersome.): -change in sex drive or performance -changes in vision -constipation -diarrhea -dry mouth -headache -nausea -tremors -trouble sleeping -upset stomach This list may not describe all possible side effects. Call your doctor for medical advice about side effects. You may report side effects to FDA at 1-800-FDA-1088. Where should I keep my medicine? Keep out of the reach of children. This medicine can be abused. Keep your medicine in a safe place to protect it from theft. Do not share this medicine with anyone. Selling or giving away this medicine is dangerous and against the law. Store at room temperature between 15 and 25 degrees C (59 and 77 degrees F). Throw away any unused medicine after the expiration date. NOTE: This sheet is a summary. It may not cover all possible information. If you have questions about this medicine, talk to your doctor, pharmacist, or health care provider.  2015, [employer] Standard. (2010-12-17 13:56:53)

## 2013-12-28 NOTE — Progress Notes (Signed)
Subjective:    Patient ID: Samantha Mitchell, female    DOB: Aug 06, 1985, 28 y.o.   MRN: [id number]  HPI  Samantha Mitchell is here today to follow up on her medications. We are going over the following issues:  1)  Type II DM:  She is doing well on the Bydureon and metformin. She does want to discuss possibly not taking the Bydureon anymore.   2)  GERD:  Controlled well on the Protonix.   3)  Weight:  She would like some help with losing additional weight for her wedding.     Review of Systems  Constitutional: Negative for activity change, appetite change and fatigue.  Cardiovascular: Negative for chest pain, palpitations and leg swelling.  Endocrine: Negative for polydipsia, polyphagia and polyuria.  Psychiatric/Behavioral: Negative for behavioral problems. The patient is not nervous/anxious.   All other systems reviewed and are negative.    Past Medical History  Diagnosis Date  . GERD (gastroesophageal reflux disease)   . Type 2 diabetes mellitus   . Polycystic ovarian disease   . Right ureteral stone   . Frequency of urination   . Urgency of urination   . History of palpitations     HAD WORK-UP W/ CARDIOLOGIST 11/2012 -- NO DX  AND NO ISSUES SINCE  . Wears glasses      Past Surgical History  Procedure Laterality Date  . Laparoscopic cholecystectomy  10-22-2007  . Colonoscopy  03-20-2010  . Esophagogastroduodenoscopy  05-23-2008  . Appendectomy  2012  . Cystoscopy with retrograde pyelogram, ureteroscopy and stent placement Right 12/03/2013    Procedure: CYSTOSCOPY WITH RETROGRADE PYELOGRAM, URETEROSCOPY;  Surgeon: Valetta Fuller, MD;  Location: [facility];  Service: Urology;  Laterality: Right;  . Holmium laser application Right 12/03/2013    Procedure: HOLMIUM LASER APPLICATION;  Surgeon: Valetta Fuller, MD;  Location: [facility];  Service: Urology;  Laterality: Right;  POSSIBLE HOLMIUM LASER     History   Social History Narrative   Marital  Status:  Engaged Programmer, multimedia)      Children:  None    Pets: Dogs (Daisy/Grumpy)    Living Situation: Lives with parents when she is home.    Occupation: Engineer, materials - [employer]    Education: Bachelor's Degree in [place]      Tobacco Use:  None    Alcohol Use:  Occasional    Drug Use:  None   Diet:  Regular   Exercise:  Limited    Hobbies:  Reading               Family History  Problem Relation Age of Onset  . Hyperlipidemia Mother   . Hypertension Mother   . Cancer Mother     Laryngeal Cancer  . GER disease Mother   . Endometriosis Mother   . Anxiety disorder Mother   . Diabetes Father   . Congestive Heart Failure Father     Viral Cardiomyopathy  . Hyperlipidemia Father   . Cancer Father   . Heart disease Maternal Grandmother   . Hypertension Maternal Grandmother   . Heart disease Maternal Grandfather   . Hypertension Maternal Grandfather   . Hyperlipidemia Maternal Grandfather   . Heart disease Paternal Grandmother   . Heart disease Paternal Grandfather      Current Outpatient Prescriptions on File Prior to Visit  Medication Sig Dispense Refill  . desogestrel-ethinyl estradiol (APRI,EMOQUETTE,SOLIA) 0.15-30 MG-MCG tablet Take 1 tablet by mouth every morning.      Marland Kitchen  Exenatide ER 2 MG PEN Inject 1 pen into the skin once a week. SUNDAYS      . naproxen sodium (ANAPROX) 220 MG tablet Take 220 mg by mouth 2 (two) times daily as needed. Take 2 tablets twice daily until stone passes. Best taken with a meal.      . prenatal vitamin w/FE, FA (PRENATAL 1 + 1) 27-1 MG TABS tablet Take 1 tablet by mouth every evening.       No current facility-administered medications on file prior to visit.     No Known Allergies      Objective:   Physical Exam  Vitals reviewed. Constitutional: She is oriented to person, place, and time.  Eyes: Conjunctivae are normal. No scleral icterus.  Neck: Neck supple. No thyromegaly present.  Cardiovascular: Normal rate, regular rhythm and normal heart  sounds.   Pulmonary/Chest: Effort normal and breath sounds normal.  Musculoskeletal: She exhibits no edema and no tenderness.  Lymphadenopathy:    She has no cervical adenopathy.  Neurological: She is alert and oriented to person, place, and time.  Skin: Skin is warm and dry.  Psychiatric: She has a normal mood and affect. Her behavior is normal. Judgment and thought content normal.      Assessment & Plan:    Samantha Mitchell was seen today for medication management.  Diagnoses and associated orders for this visit:  Type II or unspecified type diabetes mellitus without mention of complication, uncontrolled  She is going to work harder on her diet and exercise.  She wants to hold on Bydureon.  We'll see how she does on just metformin.   - POCT HgB A1C (5.8)  - metFORMIN (GLUCOPHAGE-XR) 500 MG 24 hr tablet; Take 2 tablets (1,000 mg total) by mouth 2 (two) times daily.  Gastroesophageal reflux disease without esophagitis - pantoprazole (PROTONIX) 40 MG tablet; Take 1 tablet (40 mg total) by mouth every morning   Obesity - Phentermine-Topiramate (QSYMIA) 3.75-23 MG CP24; Take 1 capsule by mouth daily. - Phentermine-Topiramate (QSYMIA) 7.5-46 MG CP24; Take 1 capsule by mouth daily.  TIME SPENT "FACE TO FACE" WITH PATIENT -  30 MINS

## 2014-01-11 ENCOUNTER — Ambulatory Visit: Payer: BC Managed Care – PPO | Admitting: Family Medicine

## 2015-06-29 IMAGING — CT CT ABD-PELV W/O CM
2 of 4 series · 16 of 46 positions shown, 18 images · non-contrast
Comparison: None available

CLINICAL DATA: right flank pain; hematuria

EXAM:
CT ABDOMEN AND PELVIS WITHOUT CONTRAST
TECHNIQUE: Multidetector CT imaging of the abdomen and pelvis was performed
following the standard protocol without IV contrast.

[Series 2: renal stone < 200 lbs 5.0 b31f · axial · 0.91mm/px · z∈[-519,-79]mm · 13 of 97 slices shown, 15 images]
[im 5/97  soft-tissue]
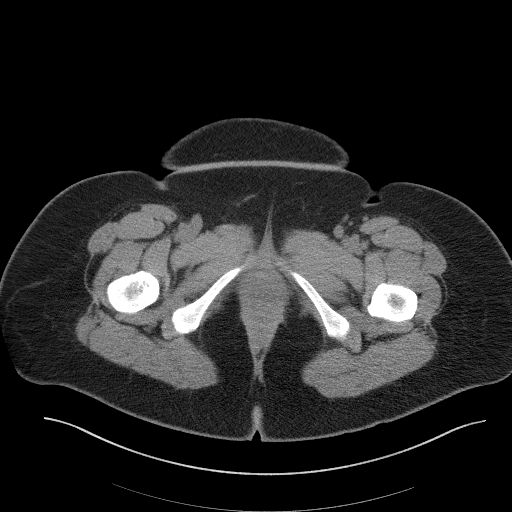
[im 5/97  bone]
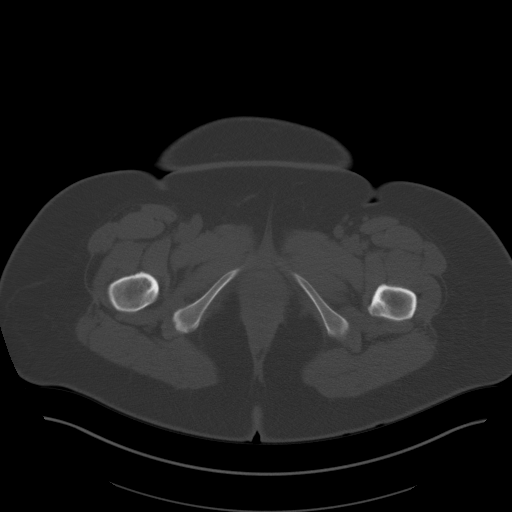
[im 13/97  soft-tissue]
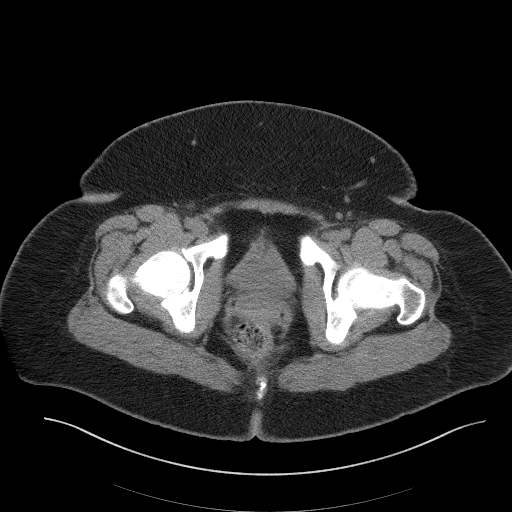
[im 21/97  soft-tissue]
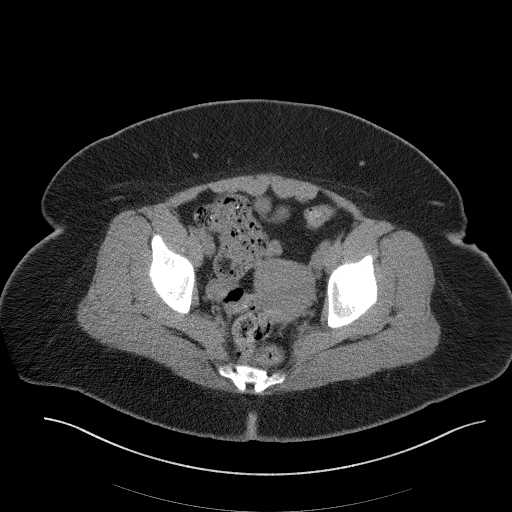
[im 29/97  soft-tissue]
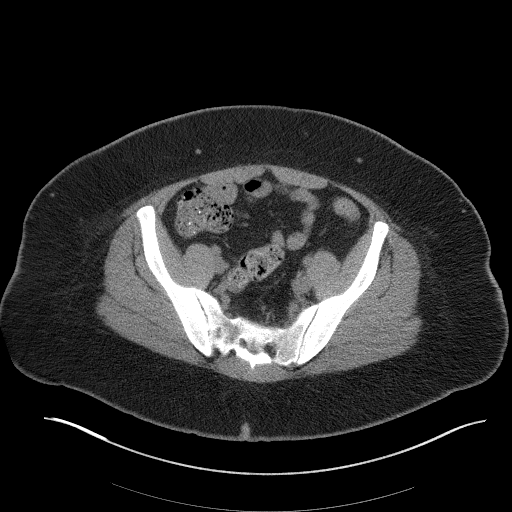
[im 33/97  soft-tissue]
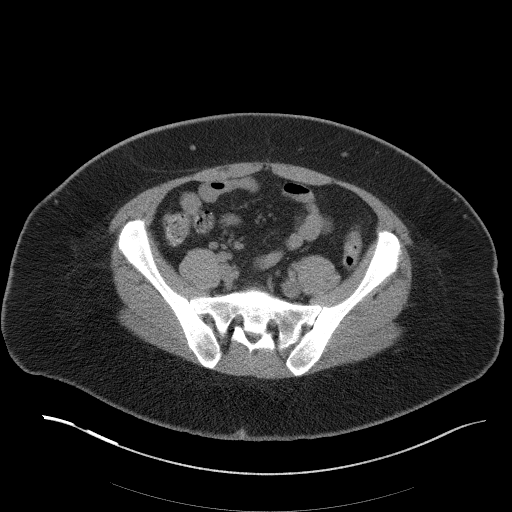
[im 41/97  soft-tissue]
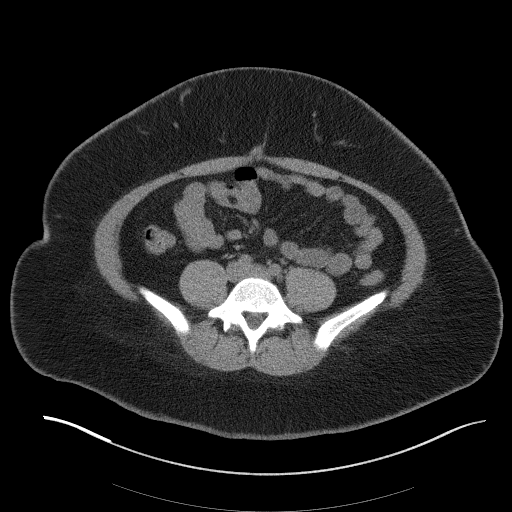
[im 49/97  soft-tissue]
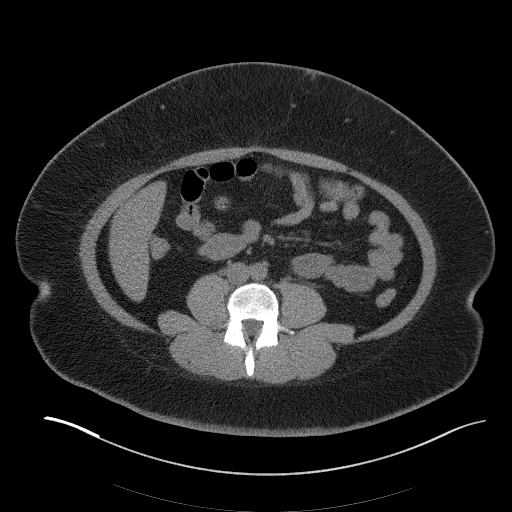
[im 57/97  soft-tissue]
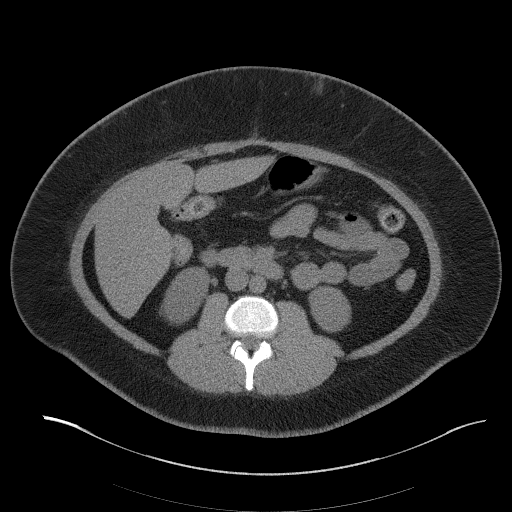
[im 65/97  soft-tissue]
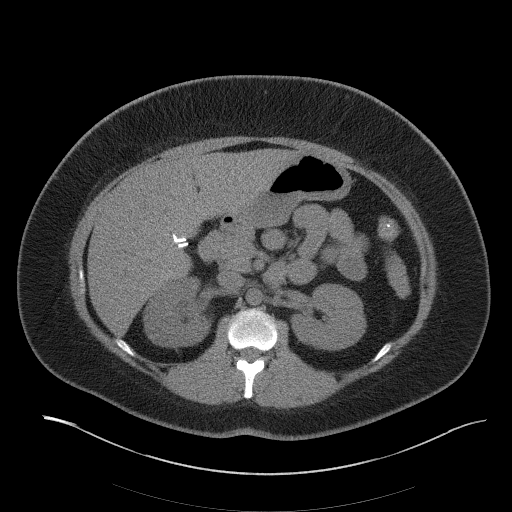
[im 65/97  bone]
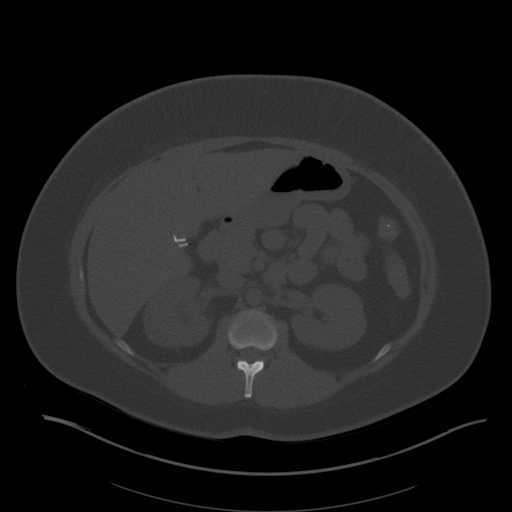
[im 69/97  soft-tissue]
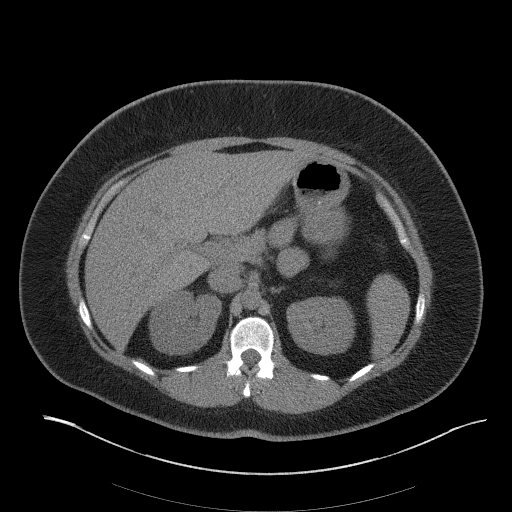
[im 77/97  soft-tissue]
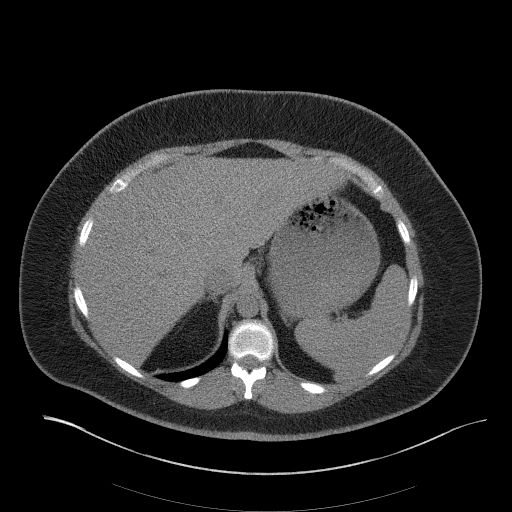
[im 85/97  soft-tissue]
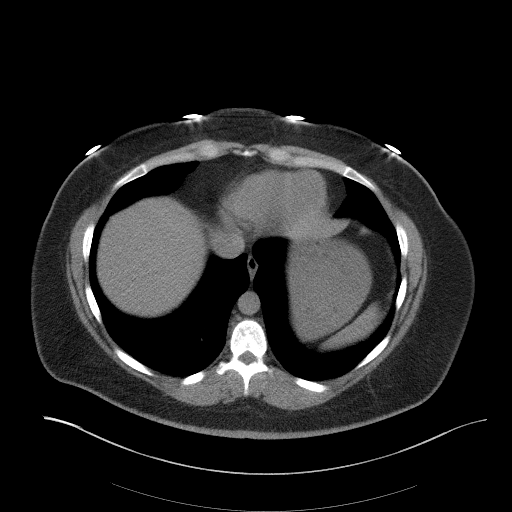
[im 93/97  soft-tissue]
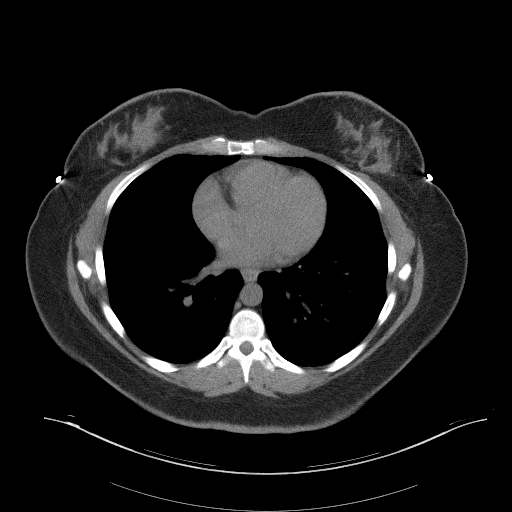

[Series 5: renal stone 3.0 coronal · coronal · 0.95mm/px · 3 of 106 slices shown]
[im 36/106  soft-tissue]
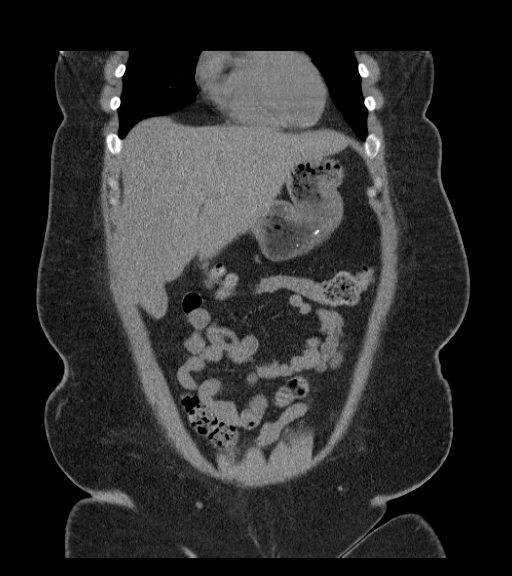
[im 47/106  soft-tissue]
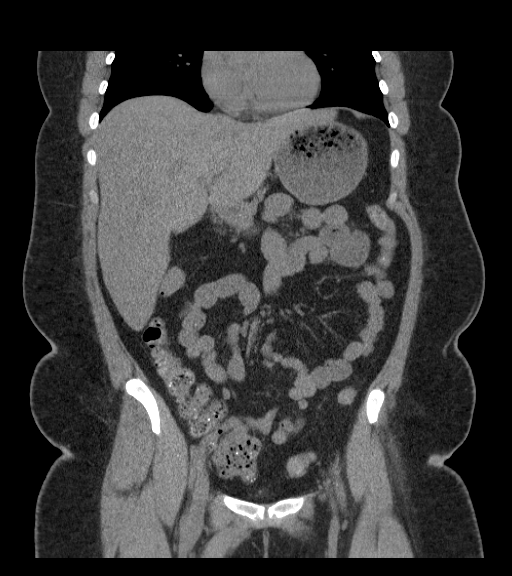
[im 59/106  soft-tissue]
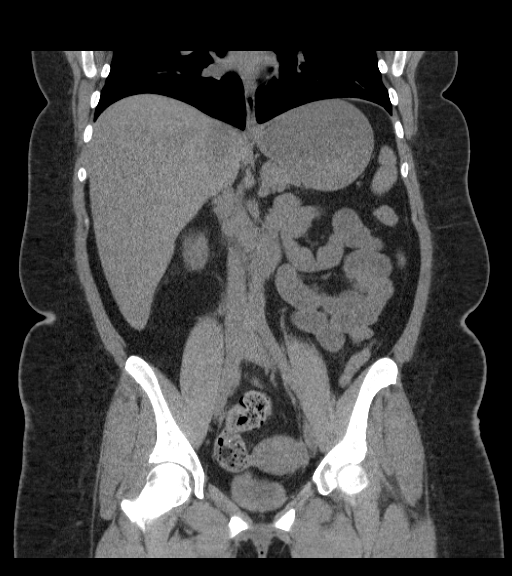

[16 of 46 positions shown; findings below may reference images not displayed]

FINDINGS: The visualized lung bases are clear.

Limited noncontrast evaluation of the liver is unremarkable.
Gallbladder is surgically absent. No biliary ductal dilatation. The
spleen, adrenal glands, and pancreas demonstrate a normal unenhanced
appearance.

The left kidney is unremarkable without evidence of nephrolithiasis
or hydronephrosis. No stones seen along the course of the left renal
collecting system.

On the right, there is an obstructive 4 mm calculus within the mid
right ureter (series 2, image 53). There is secondary mild right
hydroureteronephrosis with mild periureteral fat stranding. No other
stones seen within the right kidney or distally within the right
renal collecting system.

Stomach is within normal limits. No evidence of bowel obstruction.
No acute inflammatory changes seen about the bowels. Appendix is
normal.

Bladder is decompressed and not well evaluated. Uterus and ovaries
within normal limits.

No free air or fluid. No enlarged intra-abdominal pelvic lymph
nodes.

No acute osseous abnormality. No worrisome lytic or blastic osseous
lesions.
IMPRESSION: 1. 4 mm obstructive stone within the mid right ureter with secondary
mild right hydroureteronephrosis.
2. No other acute intra-abdominal pelvic abnormality. Normal
appendix.
